# Patient Record
Sex: Male | Born: 1995 | Race: White | Hispanic: No | Marital: Single | Smoking: Never smoker
Health system: Southern US, Community
[De-identification: ages and names within clinical notes are randomized; demographics above are authoritative.]

---

## 2021-07-23 DIAGNOSIS — F411 Generalized anxiety disorder: Secondary | ICD-10-CM | POA: Diagnosis present

## 2021-07-23 DIAGNOSIS — H9325 Central auditory processing disorder: Secondary | ICD-10-CM | POA: Insufficient documentation

## 2021-12-02 ENCOUNTER — Inpatient Hospital Stay (HOSPITAL_COMMUNITY)
Admission: EM | Admit: 2021-12-02 | Discharge: 2021-12-04 | DRG: 916 | Disposition: A | Discharge status: Home / Self Care | Payer: No Typology Code available for payment source | Attending: Internal Medicine | Admitting: Internal Medicine

## 2021-12-02 ENCOUNTER — Encounter (HOSPITAL_COMMUNITY): Payer: Self-pay | Admitting: Internal Medicine

## 2021-12-02 ENCOUNTER — Emergency Department (HOSPITAL_COMMUNITY): Payer: No Typology Code available for payment source

## 2021-12-02 ENCOUNTER — Other Ambulatory Visit: Payer: Self-pay

## 2021-12-02 DIAGNOSIS — R55 Syncope and collapse: Secondary | ICD-10-CM | POA: Diagnosis present

## 2021-12-02 DIAGNOSIS — E872 Acidosis, unspecified: Secondary | ICD-10-CM | POA: Diagnosis present

## 2021-12-02 DIAGNOSIS — E8809 Other disorders of plasma-protein metabolism, not elsewhere classified: Secondary | ICD-10-CM | POA: Diagnosis present

## 2021-12-02 DIAGNOSIS — Z91018 Allergy to other foods: Secondary | ICD-10-CM

## 2021-12-02 DIAGNOSIS — T7840XA Allergy, unspecified, initial encounter: Principal | ICD-10-CM

## 2021-12-02 DIAGNOSIS — T782XXA Anaphylactic shock, unspecified, initial encounter: Principal | ICD-10-CM | POA: Diagnosis present

## 2021-12-02 DIAGNOSIS — Z91013 Allergy to seafood: Secondary | ICD-10-CM | POA: Diagnosis not present

## 2021-12-02 DIAGNOSIS — Z886 Allergy status to analgesic agent status: Secondary | ICD-10-CM | POA: Diagnosis not present

## 2021-12-02 DIAGNOSIS — F411 Generalized anxiety disorder: Secondary | ICD-10-CM | POA: Diagnosis present

## 2021-12-02 DIAGNOSIS — S0083XA Contusion of other part of head, initial encounter: Secondary | ICD-10-CM | POA: Diagnosis present

## 2021-12-02 DIAGNOSIS — E876 Hypokalemia: Secondary | ICD-10-CM | POA: Diagnosis present

## 2021-12-02 DIAGNOSIS — R569 Unspecified convulsions: Secondary | ICD-10-CM | POA: Diagnosis present

## 2021-12-02 DIAGNOSIS — S0081XA Abrasion of other part of head, initial encounter: Secondary | ICD-10-CM | POA: Diagnosis present

## 2021-12-02 DIAGNOSIS — R251 Tremor, unspecified: Secondary | ICD-10-CM | POA: Diagnosis not present

## 2021-12-02 DIAGNOSIS — D72829 Elevated white blood cell count, unspecified: Secondary | ICD-10-CM | POA: Diagnosis present

## 2021-12-02 DIAGNOSIS — S0990XA Unspecified injury of head, initial encounter: Secondary | ICD-10-CM | POA: Diagnosis not present

## 2021-12-02 LAB — BASIC METABOLIC PANEL
Anion gap: 6 (ref 5–15)
BUN: 21 mg/dL — ABNORMAL HIGH (ref 6–20)
CO2: 21 mmol/L — ABNORMAL LOW (ref 22–32)
Calcium: 7.7 mg/dL — ABNORMAL LOW (ref 8.9–10.3)
Chloride: 117 mmol/L — ABNORMAL HIGH (ref 98–111)
Creatinine, Ser: 0.84 mg/dL (ref 0.61–1.24)
GFR, Estimated: >60 mL/min (ref >60)
Glucose, Bld: 107 mg/dL — ABNORMAL HIGH (ref 70–99)
Potassium: 2.8 mmol/L — ABNORMAL LOW (ref 3.5–5.1)
Sodium: 144 mmol/L (ref 135–145)

## 2021-12-02 LAB — COMPREHENSIVE METABOLIC PANEL
ALT: 13 U/L (ref 0–44)
AST: 17 U/L (ref 15–41)
Albumin: 3 g/dL — ABNORMAL LOW (ref 3.5–5.0)
Alkaline Phosphatase: 31 U/L — ABNORMAL LOW (ref 38–126)
Anion gap: 6 (ref 5–15)
BUN: 19 mg/dL (ref 6–20)
CO2: 16 mmol/L — ABNORMAL LOW (ref 22–32)
Calcium: 6.2 mg/dL — CL (ref 8.9–10.3)
Chloride: 121 mmol/L — ABNORMAL HIGH (ref 98–111)
Creatinine, Ser: 0.74 mg/dL (ref 0.61–1.24)
GFR, Estimated: >60 mL/min (ref >60)
Glucose, Bld: 146 mg/dL — ABNORMAL HIGH (ref 70–99)
Potassium: 2.3 mmol/L — CL (ref 3.5–5.1)
Sodium: 143 mmol/L (ref 135–145)
Total Bilirubin: 0.6 mg/dL (ref 0.3–1.2)
Total Protein: 4.5 g/dL — ABNORMAL LOW (ref 6.5–8.1)

## 2021-12-02 LAB — CBC WITH DIFFERENTIAL/PLATELET
Abs Immature Granulocytes: 0.06 10*3/uL (ref 0.00–0.07)
Basophils Absolute: 0 10*3/uL (ref 0.0–0.1)
Basophils Relative: 0 %
Eosinophils Absolute: 0 10*3/uL (ref 0.0–0.5)
Eosinophils Relative: 0 %
HCT: 42.7 % (ref 39.0–52.0)
Hemoglobin: 14.7 g/dL (ref 13.0–17.0)
Immature Granulocytes: 0 %
Lymphocytes Relative: 4 %
Lymphs Abs: 0.6 10*3/uL — ABNORMAL LOW (ref 0.7–4.0)
MCH: 28.8 pg (ref 26.0–34.0)
MCHC: 34.4 g/dL (ref 30.0–36.0)
MCV: 83.6 fL (ref 80.0–100.0)
Monocytes Absolute: 0.4 10*3/uL (ref 0.1–1.0)
Monocytes Relative: 3 %
Neutro Abs: 14.1 10*3/uL — ABNORMAL HIGH (ref 1.7–7.7)
Neutrophils Relative %: 93 %
Platelets: 183 10*3/uL (ref 150–400)
RBC: 5.11 MIL/uL (ref 4.22–5.81)
RDW: 12.7 % (ref 11.5–15.5)
WBC: 15.3 10*3/uL — ABNORMAL HIGH (ref 4.0–10.5)
nRBC: 0 % (ref 0.0–0.2)

## 2021-12-02 LAB — URINALYSIS, ROUTINE W REFLEX MICROSCOPIC
Bilirubin Urine: NEGATIVE
Glucose, UA: 50 mg/dL — AB
Ketones, ur: 5 mg/dL — AB
Leukocytes,Ua: NEGATIVE
Nitrite: NEGATIVE
Protein, ur: NEGATIVE mg/dL
Specific Gravity, Urine: 1.016 (ref 1.005–1.030)
pH: 5 (ref 5.0–8.0)

## 2021-12-02 LAB — TROPONIN I (HIGH SENSITIVITY): Troponin I (High Sensitivity): 5 ng/L (ref <18)

## 2021-12-02 LAB — RAPID URINE DRUG SCREEN, HOSP PERFORMED
Amphetamines: NOT DETECTED
Barbiturates: NOT DETECTED
Benzodiazepines: NOT DETECTED
Cocaine: NOT DETECTED
Opiates: NOT DETECTED
Tetrahydrocannabinol: NOT DETECTED

## 2021-12-02 LAB — CBG MONITORING, ED: Glucose-Capillary: 165 mg/dL — ABNORMAL HIGH (ref 70–99)

## 2021-12-02 LAB — MAGNESIUM: Magnesium: 2.2 mg/dL (ref 1.7–2.4)

## 2021-12-02 LAB — TSH: TSH: 0.634 u[IU]/mL (ref 0.350–4.500)

## 2021-12-02 IMAGING — CT CT CERVICAL SPINE W/O CM
3 of 4 series · 13 of 33 positions shown, 16 images · non-contrast
Comparison: None Available.

CLINICAL DATA: Fall with possible seizure and head injury while in
shower.



[Series 6: orthogonal bone · axial · 0.40mm/px · z∈[-331,-186]mm · 5 of 113 slices shown, 7 images]
[im 19/113  soft-tissue]
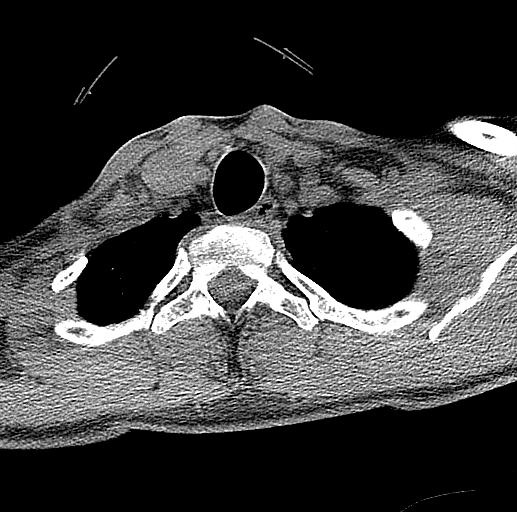
[im 19/113  bone]
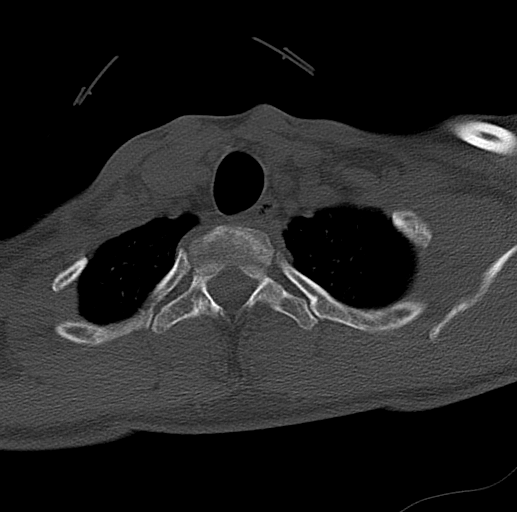
[im 38/113  bone]
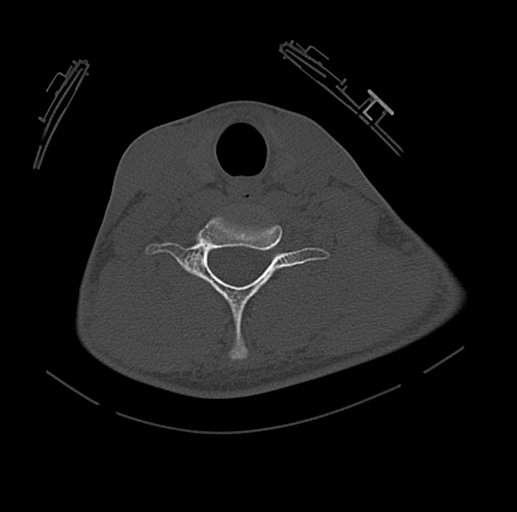
[im 57/113  bone]
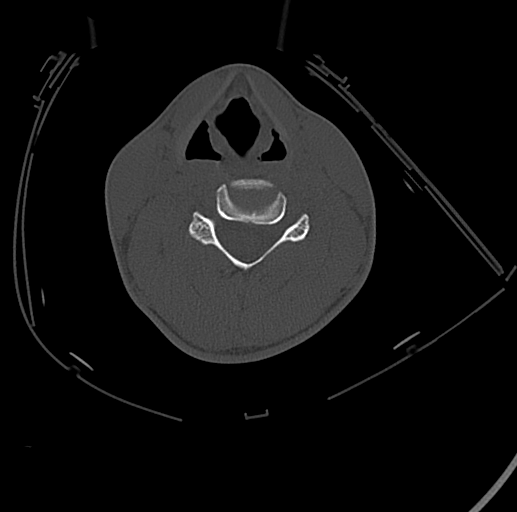
[im 75/113  bone]
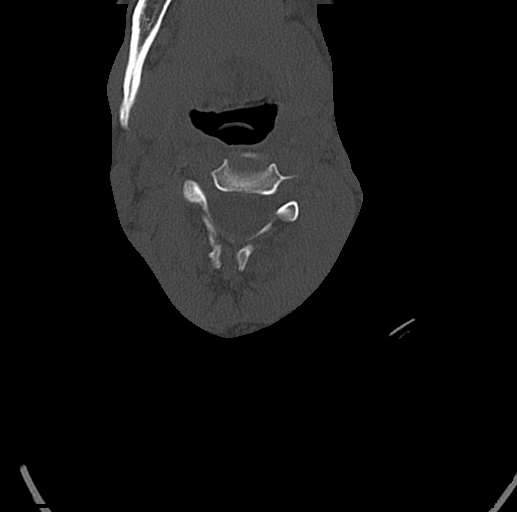
[im 94/113  soft-tissue]
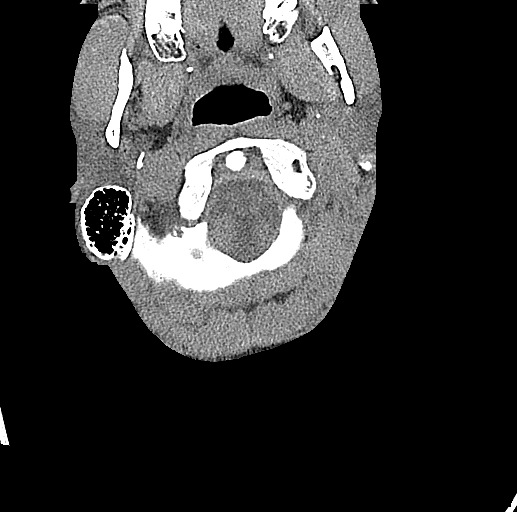
[im 94/113  bone]
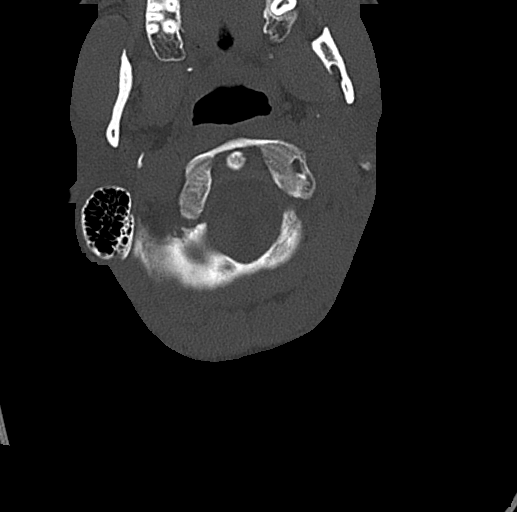

[Series 7: coronal bone · coronal · 0.40mm/px · 3 of 95 slices shown]
[im 19/95  bone]
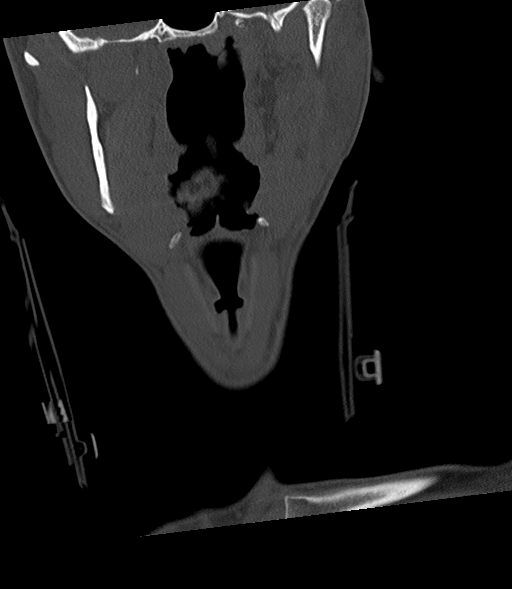
[im 38/95  bone]
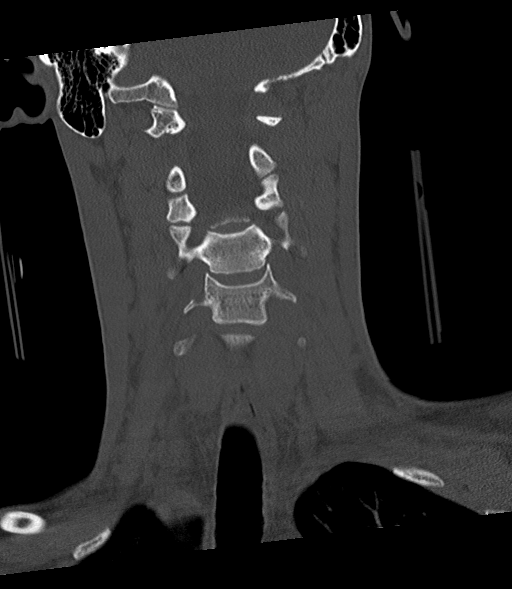
[im 57/95  bone]
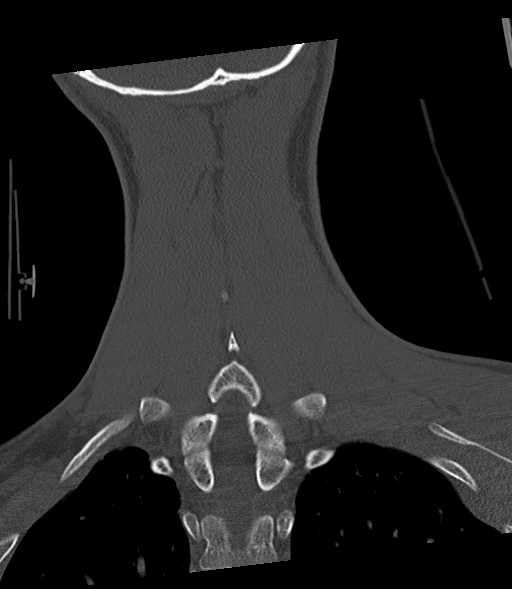

[Series 8: sagittal bone · sagittal · 0.32mm/px · 5 of 61 slices shown, 6 images]
[im 21/61  bone]
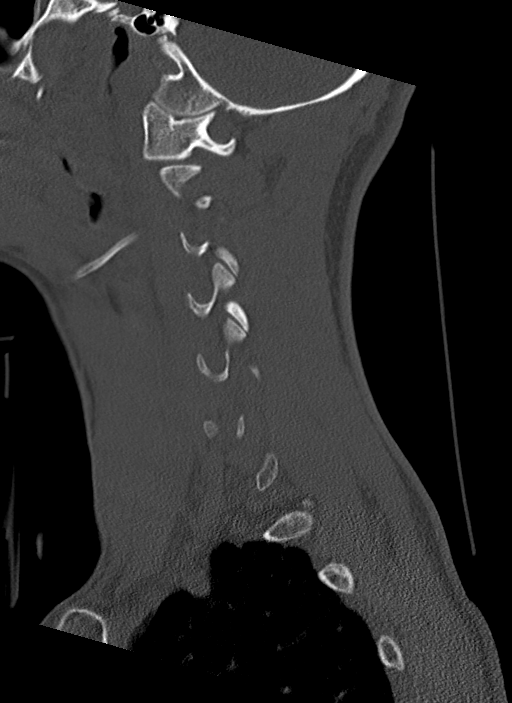
[im 26/61  bone]
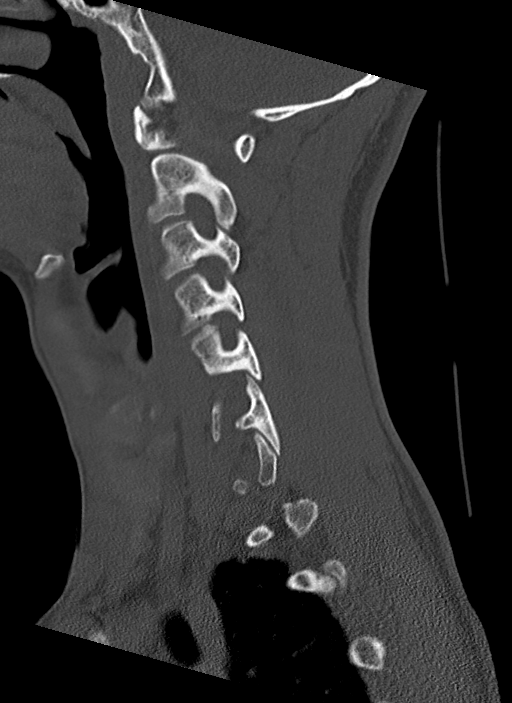
[im 31/61  soft-tissue]
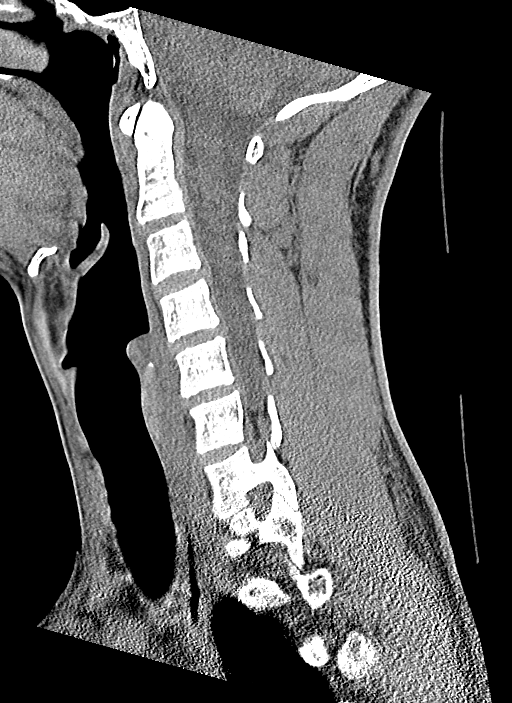
[im 31/61  bone]
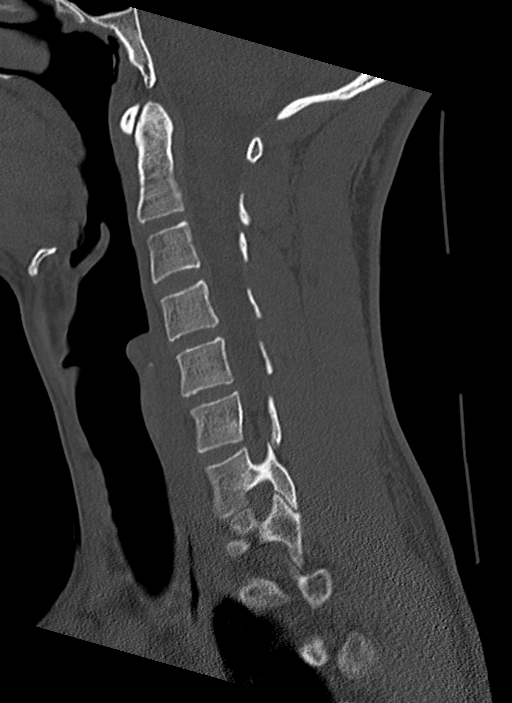
[im 36/61  bone]
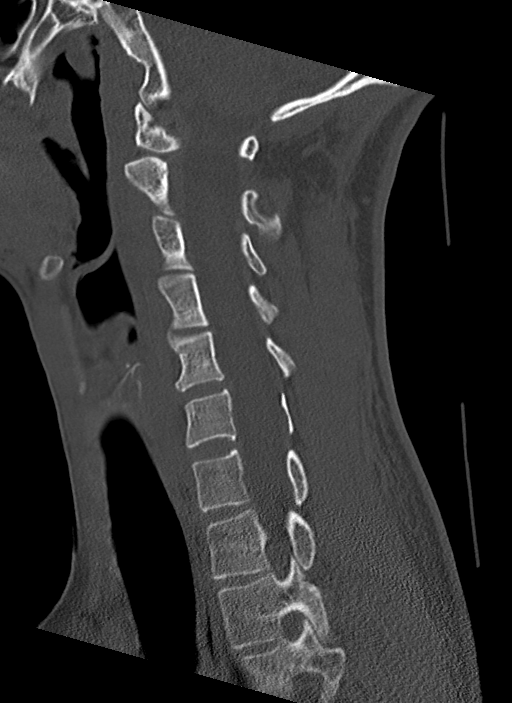
[im 41/61  bone]
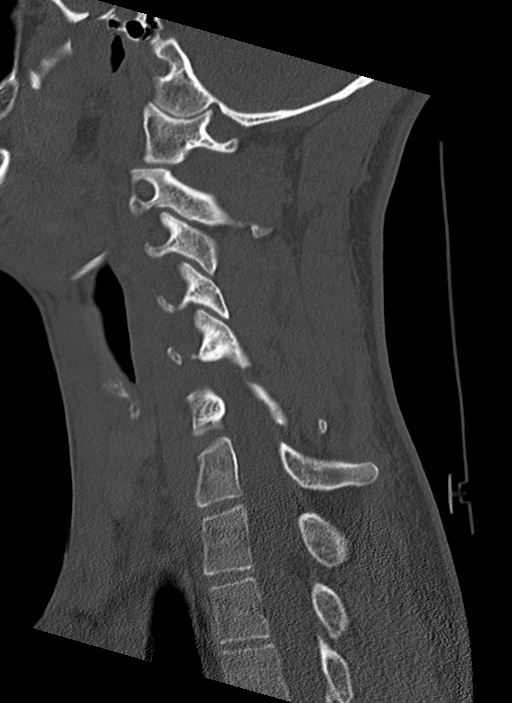

[13 of 33 positions shown; findings below may reference images not displayed]

FINDINGS: CT HEAD FINDINGS

Brain: Ventricles, cisterns and other CSF spaces are normal. There
is no mass, mass effect, shift of midline structures or acute
hemorrhage. No evidence of acute infarction.

Vascular: No hyperdense vessel or unexpected calcification.

Skull: Normal. Negative for fracture or focal lesion.

Sinuses/Orbits: No acute finding.

Other: None.

CT CERVICAL SPINE FINDINGS

Alignment: Normal.

Skull base and vertebrae: Vertebral body heights are normal.
Atlantoaxial articulation is normal. No significant neural foraminal
narrowing. No acute fracture.

Soft tissues and spinal canal: Prevertebral soft tissues are normal.
No significant spinal canal stenosis.

Disc levels:  Minimal broad-based disc bulge at the C3-4 level.

Upper chest: No acute findings.

Other: None.
IMPRESSION: 1. Normal head CT.
2. No acute cervical spine injury.
3. Minimal degenerative disc disease at the C3-4 level.

## 2021-12-02 IMAGING — CT CT HEAD W/O CM
4 series · 16 of 47 positions shown, 18 images · non-contrast
Comparison: None Available.

CLINICAL DATA: Fall with possible seizure and head injury while in
shower.



[Series 2: head wo · axial · 0.47mm/px · z∈[-145,-25]mm · 7 of 32 slices shown, 9 images]
[im 4/32  brain]
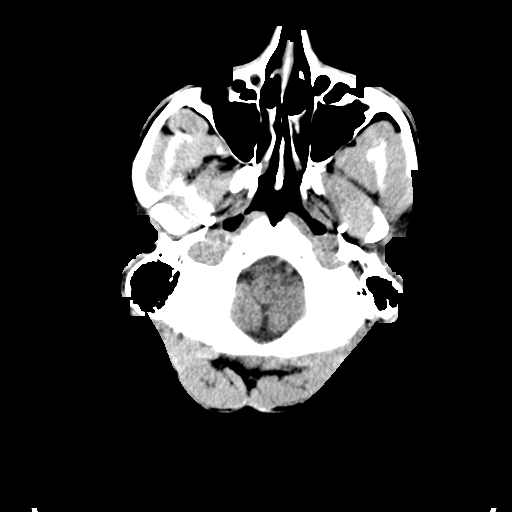
[im 4/32  bone]
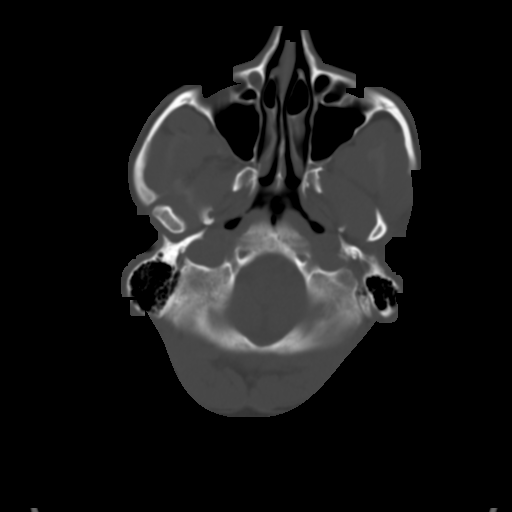
[im 8/32  brain]
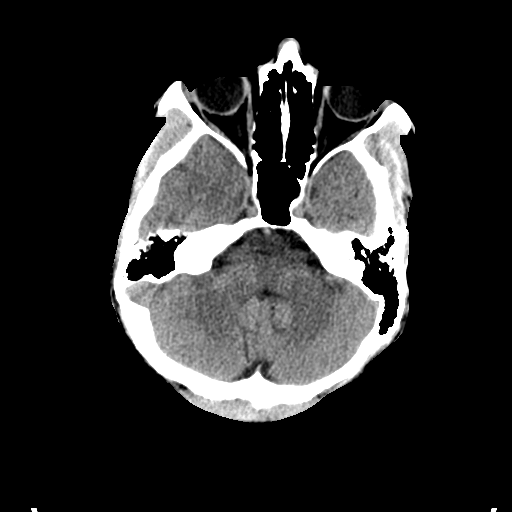
[im 12/32  brain]
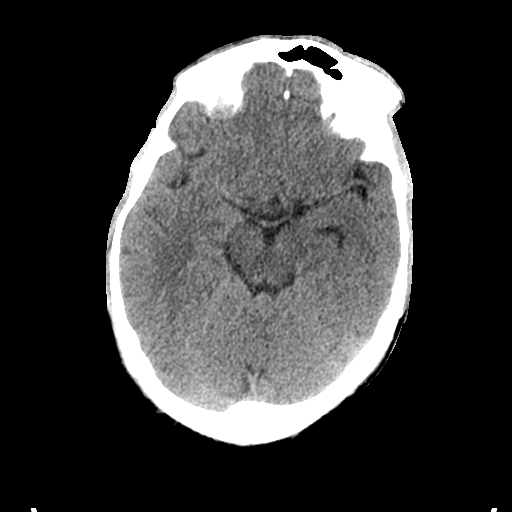
[im 16/32  brain]
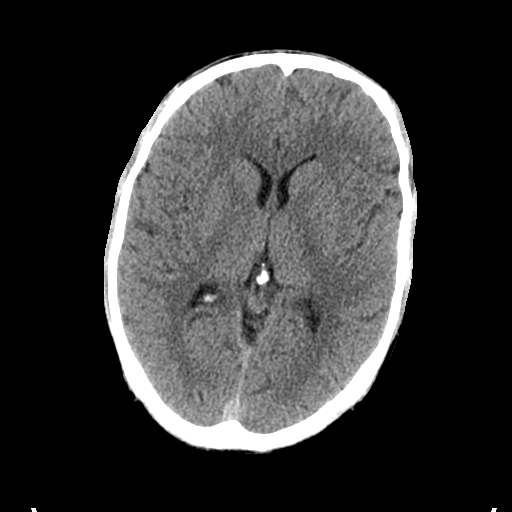
[im 20/32  brain]
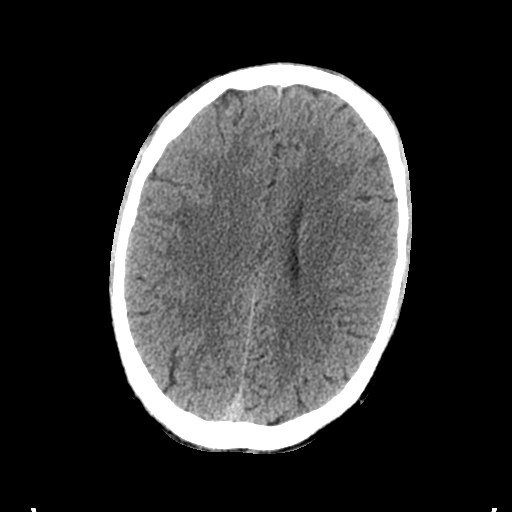
[im 20/32  bone]
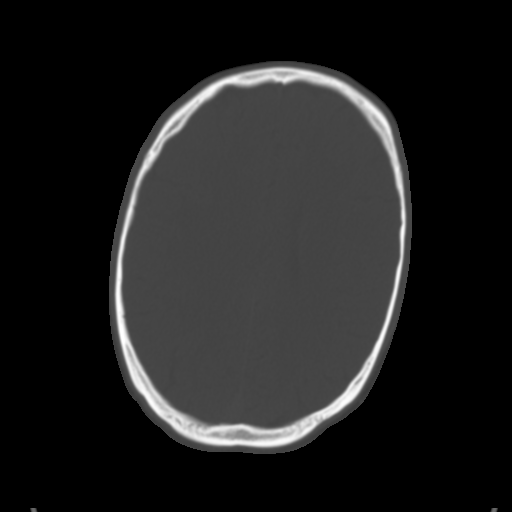
[im 24/32  brain]
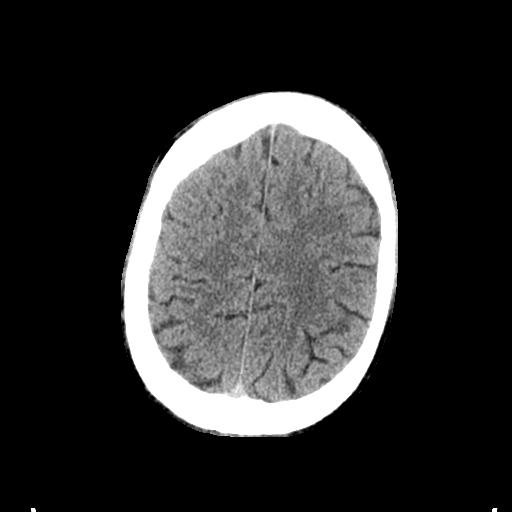
[im 28/32  brain]
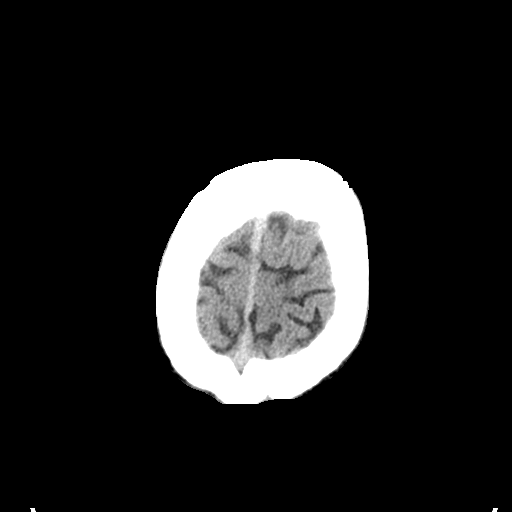

[Series 3: head bone · axial · 0.47mm/px · z∈[-146,-114]mm · 3 of 78 slices shown]
[im 8/78  bone]
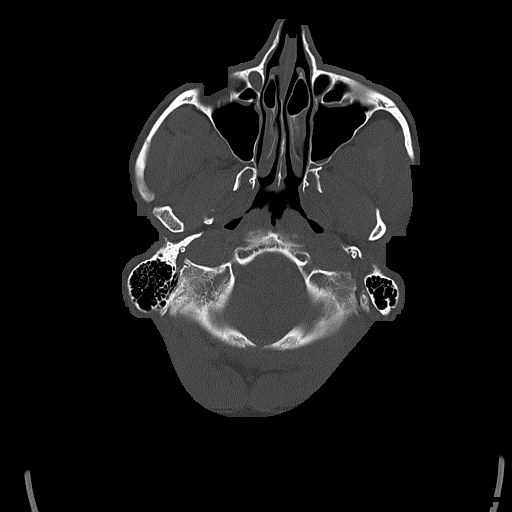
[im 16/78  bone]
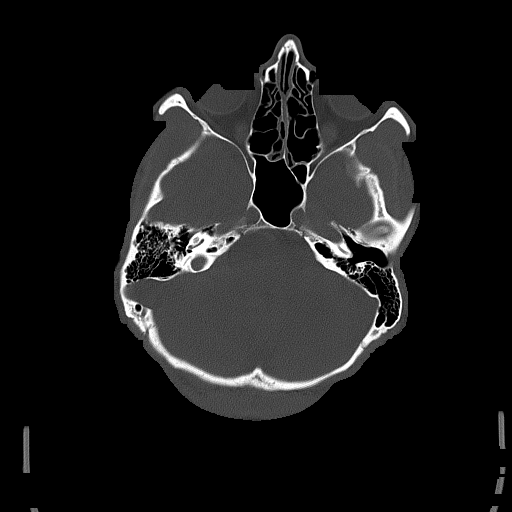
[im 24/78  bone]
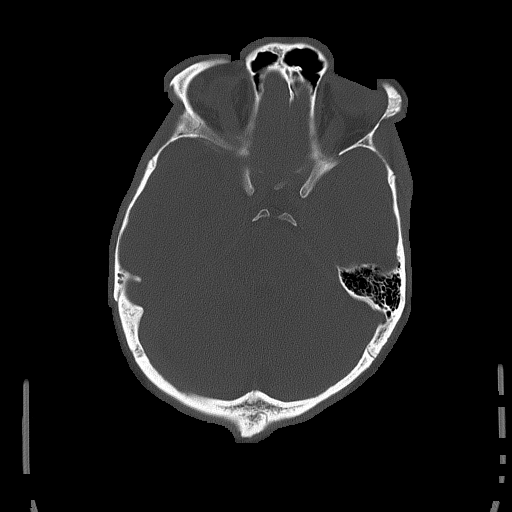

[Series 5: coronal soft tissue · coronal · 0.30mm/px · 3 of 73 slices shown]
[im 25/73  brain]
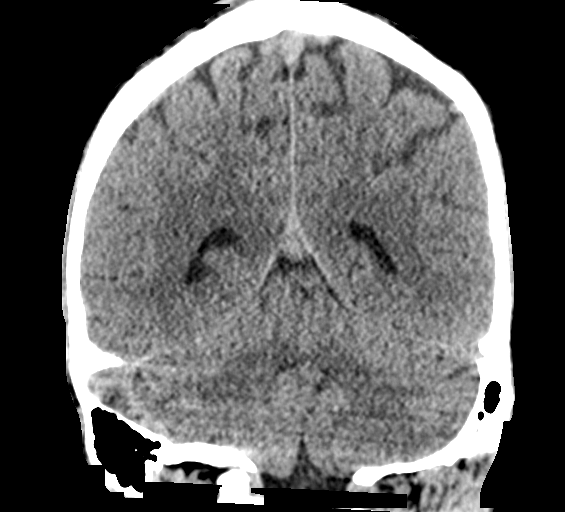
[im 33/73  brain]
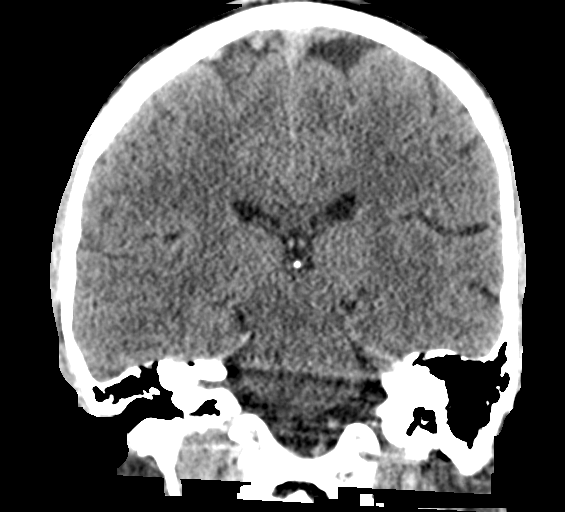
[im 41/73  brain]
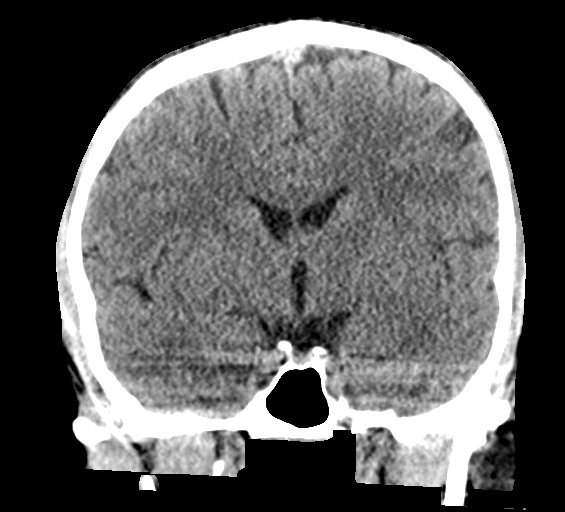

[Series 6: sagittal soft tissue · sagittal · 0.30mm/px · 3 of 58 slices shown]
[im 20/58  brain]
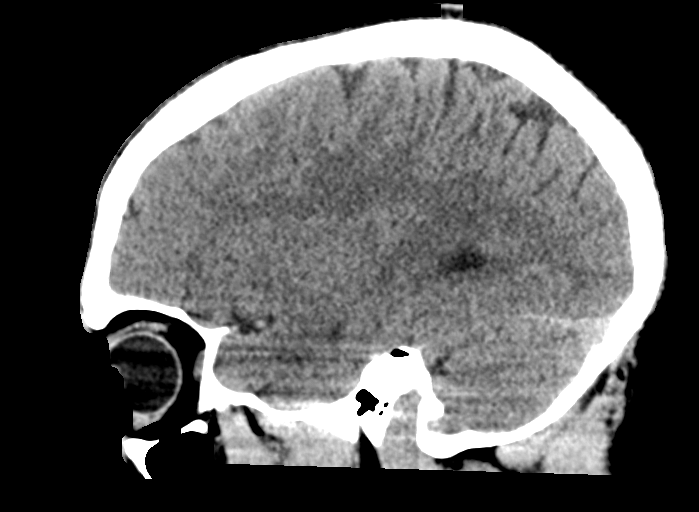
[im 29/58  brain]
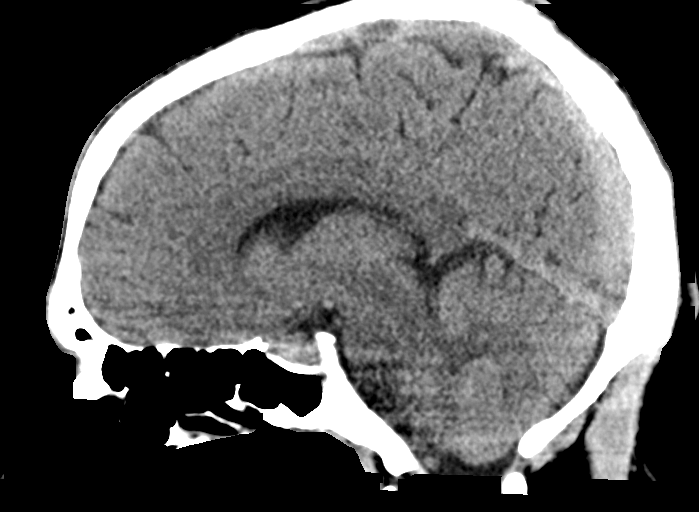
[im 39/58  brain]
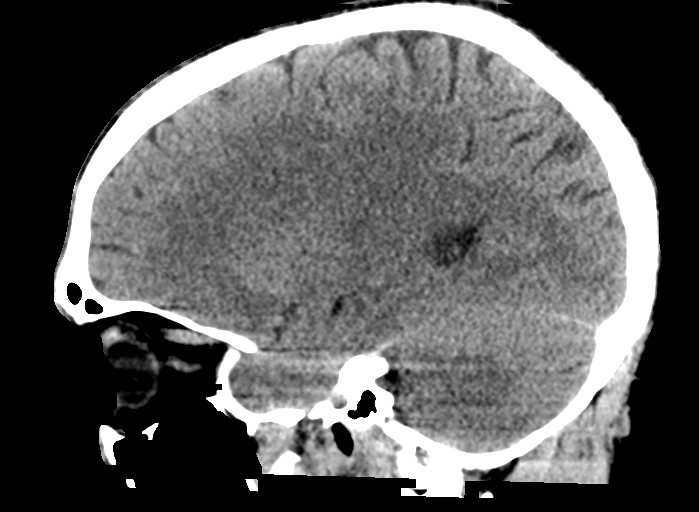

[16 of 47 positions shown; findings below may reference images not displayed]

FINDINGS: CT HEAD FINDINGS

Brain: Ventricles, cisterns and other CSF spaces are normal. There
is no mass, mass effect, shift of midline structures or acute
hemorrhage. No evidence of acute infarction.

Vascular: No hyperdense vessel or unexpected calcification.

Skull: Normal. Negative for fracture or focal lesion.

Sinuses/Orbits: No acute finding.

Other: None.

CT CERVICAL SPINE FINDINGS

Alignment: Normal.

Skull base and vertebrae: Vertebral body heights are normal.
Atlantoaxial articulation is normal. No significant neural foraminal
narrowing. No acute fracture.

Soft tissues and spinal canal: Prevertebral soft tissues are normal.
No significant spinal canal stenosis.

Disc levels:  Minimal broad-based disc bulge at the C3-4 level.

Upper chest: No acute findings.

Other: None.
IMPRESSION: 1. Normal head CT.
2. No acute cervical spine injury.
3. Minimal degenerative disc disease at the C3-4 level.

## 2021-12-02 MED ORDER — ONDANSETRON HCL 4 MG PO TABS: 4.0000 mg | ORAL_TABLET | Freq: Four times a day (QID) | ORAL | Status: DC | PRN

## 2021-12-02 MED ORDER — POTASSIUM CHLORIDE 10 MEQ/100ML IV SOLN
10.0000 meq | INTRAVENOUS | Status: AC
  Administered 2021-12-02 (×3): 10 meq via INTRAVENOUS
  Filled 2021-12-02 (×3): qty 100

## 2021-12-02 MED ORDER — SODIUM CHLORIDE 0.9 % IV BOLUS
1000.0000 mL | Freq: Once | INTRAVENOUS | Status: AC
  Administered 2021-12-02: 1000 mL via INTRAVENOUS

## 2021-12-02 MED ORDER — POTASSIUM CHLORIDE IN NACL 40-0.9 MEQ/L-% IV SOLN
INTRAVENOUS | Status: DC
  Filled 2021-12-02 (×2): qty 1000

## 2021-12-02 MED ORDER — SODIUM CHLORIDE 0.9% FLUSH
3.0000 mL | Freq: Two times a day (BID) | INTRAVENOUS | Status: DC
  Administered 2021-12-02 – 2021-12-03 (×2): 3 mL via INTRAVENOUS

## 2021-12-02 MED ORDER — CALCIUM GLUCONATE-NACL 1-0.675 GM/50ML-% IV SOLN
1.0000 g | Freq: Once | INTRAVENOUS | Status: AC
  Administered 2021-12-02: 1000 mg via INTRAVENOUS
  Filled 2021-12-02: qty 50

## 2021-12-02 MED ORDER — ONDANSETRON HCL 4 MG/2ML IJ SOLN: 4.0000 mg | Freq: Four times a day (QID) | INTRAMUSCULAR | Status: DC | PRN

## 2021-12-02 NOTE — H&P (Signed)
History and Physical    Patient: Danny Rich GHW:[id number] DOB: August 01, 1995 DOA: 12/02/2021 DOS: the patient was seen and examined on 12/02/2021 PCP: Jordan Hawks, PA-C  Patient coming from: Home  Chief Complaint:  Chief Complaint  Patient presents with   Seizures   Loss of Consciousness   Allergic Reaction   HPI: Danny Rich is a 26 y.o. 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  Patient has been careful what he uses including his soap and other products due to his history of anaphylaxis.  He was apparently in the shower when mom heard a loud sound.  The last thing he remembered was him taking a shower with his cell.  When EMS arrived patient was having significant rashes all over with swollen face and side of early anaphylaxis.  He was noted to have passed out and also having some seizure-like activities.  On arrival in the ER patient has improvement fully awake.  He received epinephrine, Benadryl, Zofran, Solu-Medrol and albuterol nebs.  So far the rashes have resolved.  He did have some cough initially which also has resolved apparently after treatment.  He was placed on E. coli brought to the ER.  Patient states he has used the same type of soap for a while.  The.  He passed out was brief with no postictal symptoms.  Additionally patient was found to have potassium of 2.3 as well as hypocalcemia.  He has been eating and drinking adequately.  He drinks mostly Water.  Urine drug screen negative for any drugs.  Patient is being admitted to the hospital for further evaluation and treatment.  Review of Systems: As mentioned in the history of present illness. All other systems reviewed and are negative. History reviewed. No pertinent past medical history. History reviewed. No pertinent surgical  history. Social History:  has no history on file for tobacco use, alcohol use, and drug use.  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   Shellfish Allergy Anaphylaxis    History reviewed. No pertinent family history.  Prior to Admission medications   Medication Sig Start Date End Date Taking? Authorizing Provider  acetaminophen (TYLENOL) 500 MG tablet Take 1,000 mg by mouth daily as needed for headache.   Yes [provider]  vitamin C (ASCORBIC ACID) 500 MG tablet Take 1,000 mg by mouth at bedtime.   Yes [provider]    Physical Exam: Vitals:   12/02/21 1945 12/02/21 2000 12/02/21 2100 12/02/21 2200  BP:  104/60 100/62 118/62  Pulse: 96 88 87 97  Resp: 17 17 16 10   Temp:      TempSrc:      SpO2: 96% 95% 96% 97%  Weight:      Height:        General: Stable with no acute distress very anxious HEENT: PERRL, EOMI, no pallor no jaundice, mild bruising on the face around the chin Neck: Supple, no JVD no lymphadenopathy  Respiratory: Good air entry bilaterally no wheeze rales or crackles Cardiovascular: Sinus tachycardia Abdomen: Soft nontender with positive bowel sounds Extremities: No significant edema sinus or clubbing Skin exam: Multiple rashes fading away consistent with urticaria Musculoskeletal: No joint swelling tenderness or muscle wasting Neuro exam: Normal power, normal tone, nonfocal exam Psych: Anxious, awake alert and oriented x3  Data Reviewed:  Afebrile, blood pressure 150/85, pulse 100, respiratory 28, sats 94% on room air White count 15.3, potassium 2.3, chloride 117, CO2 21 BUN 20 creatinine  1.84 calcium 6.2 urinalysis so far negative urine drug screen is negative.  CT cervical spine and CT head without contrast negative.  EKG showed normal sinus rhythm  Assessment and Plan:   #1 syncope versus seizure: Patient most likely had a syncopal episode based on presentation.  No prior history of seizures.  With severe hypokalemia and acute anaphylactic reaction he more than likely had a syncopal episode.  Additionally patient did not go into postictal state.  We will admit the patient still in the with seizure precaution.  Work-up of the syncopal episode.  Get echocardiogram and MRI of the brain.  Hydrate patient.  Replete electrolytes.  #2 severe hypokalemia: No obvious cause.  Patient denies nausea vomiting or diarrhea.  He has not had any medications including diuretics.  At this point we will replete potassium.  Check magnesium level.  Encourage patient to stay hydrated at home.  If magnesium is low this may also play a role in possible syncope if he had arrhythmias.  #3 anaphylactic reaction: Patient appears to have had anaphylactic reaction tonight.  He is usually having reactions to the substance listed above.  Suspected.  May have caused at this time around.  He will avoid that.  In the meantime monitor the patient.  Treat with steroids Benadryl as needed.  Also epinephrine.  #4 generalized anxiety disorder: Confirm home regimen and continue.  Add Ativan as needed  #5 leukocytosis: Possibly demargination and dehydration.  #6 hypocalcemia: Related to hypocalcemia.  Corrected calcium close to normal.  Continue monitoring and recheck    Advance Care Planning:   Code Status: Not on file full code  Consults: None  Family Communication: Mother at bedside  Severity of Illness: The appropriate patient status for this patient is INPATIENT. Inpatient status is judged to be reasonable and necessary in order to provide the required intensity of service to ensure the patient's safety. The  patient's presenting symptoms, physical exam findings, and initial radiographic and laboratory data in the context of their chronic comorbidities is felt to place them at high risk for further clinical deterioration. Furthermore, it is not anticipated that the patient will be medically stable for discharge from the hospital within 2 midnights of admission.   * I certify that at the point of admission it is my clinical judgment that the patient will require inpatient hospital care spanning beyond 2 midnights from the point of admission due to high intensity of service, high risk for further deterioration and high frequency of surveillance required.*  AuthorLonia Blood, MD 12/02/2021 11:18 PM  For on call review [email or web address].

## 2021-12-02 NOTE — ED Provider Notes (Signed)
[facility]-EMERGENCY DEPT Provider Note   CSN: [id number] Arrival date & time: 12/02/21  1205     History  Chief Complaint  Patient presents with   Seizures   Loss of Consciousness   Allergic Reaction    Danny Rich is a 26 y.o. male.  The history is provided by the patient, medical records and the EMS personnel (ems report to nursing). No language interpreter was used.  Seizures Seizure activity on arrival: no   Seizure type:  Unable to specify Return to baseline: yes   Progression:  Resolved Context: not family hx of seizures, not fever, not possible medication ingestion and not previous head injury   Recent head injury:  During the event PTA treatment:  None History of seizures: no   Loss of Consciousness Episode history:  Single Most recent episode:  Today Timing:  Unable to specify Witnessed: no   Relieved by:  Nothing Worsened by:  Nothing Ineffective treatments:  None tried Associated symptoms: difficulty breathing, nausea, seizures and shortness of breath (wheeze and cough)   Associated symptoms: no chest pain, no confusion, no diaphoresis, no dizziness, no fever, no focal weakness, no headaches, no palpitations, no vomiting and no weakness   Allergic Reaction Presenting symptoms: difficulty breathing, rash, swelling and wheezing   Severity:  Severe Prior allergic episodes:  Food/nut allergies Relieved by:  Antihistamines, epinephrine, steroids and bronchodilators Worsened by:  Nothing      Home Medications Prior to Admission medications   Not on File      Allergies    Aspirin, Shellfish allergy, Cashew nut oil, Hazelnut (filbert) allergy skin test, and Motrin [ibuprofen]    Review of Systems   Review of Systems  Constitutional:  Negative for chills, diaphoresis, fatigue and fever.  HENT:  Negative for congestion.   Eyes:  Negative for visual disturbance.  Respiratory:  Positive for cough, shortness of breath (wheeze and  cough) and wheezing. Negative for chest tightness.   Cardiovascular:  Positive for syncope. Negative for chest pain and palpitations.  Gastrointestinal:  Positive for nausea. Negative for abdominal pain, constipation, diarrhea and vomiting.  Genitourinary:  Negative for dysuria and flank pain.  Musculoskeletal:  Negative for back pain, neck pain and neck stiffness.  Skin:  Positive for rash and wound (bruising from fall).  Neurological:  Positive for seizures. Negative for dizziness, focal weakness, speech difficulty, weakness, light-headedness, numbness and headaches.  Psychiatric/Behavioral:  Negative for agitation, behavioral problems and confusion.   All other systems reviewed and are negative.   Physical Exam Updated Vital Signs BP (!) 152/85   Pulse 97   Temp 97.9 F (36.6 C) (Oral)   Resp 18   Ht 6' (1.829 m)   Wt 65.8 kg   SpO2 100%   BMI 19.67 kg/m  Physical Exam Vitals and nursing note reviewed.  Constitutional:      General: He is not in acute distress.    Appearance: He is well-developed.  HENT:     Head: Abrasion and contusion present.     Comments: Bruising to chin and abrasion to right forehead    Nose: No congestion or rhinorrhea.     Mouth/Throat:     Mouth: Mucous membranes are moist.     Pharynx: No oropharyngeal exudate or posterior oropharyngeal erythema.  Eyes:     Extraocular Movements: Extraocular movements intact.     Conjunctiva/sclera: Conjunctivae normal.     Pupils: Pupils are equal, round, and reactive to light.  Cardiovascular:  Rate and Rhythm: Normal rate and regular rhythm.     Heart sounds: No murmur heard. Pulmonary:     Effort: Pulmonary effort is normal. No respiratory distress.     Breath sounds: Normal breath sounds. No wheezing, rhonchi or rales.  Chest:     Chest wall: No tenderness.  Abdominal:     Palpations: Abdomen is soft.     Tenderness: There is no abdominal tenderness. There is no right CVA tenderness, left CVA  tenderness, guarding or rebound.  Musculoskeletal:        General: No swelling or tenderness.     Cervical back: Neck supple. No tenderness.     Right lower leg: No edema.     Left lower leg: No edema.  Skin:    General: Skin is warm and dry.     Capillary Refill: Capillary refill takes less than 2 seconds.     Findings: Bruising and rash present. No erythema.  Neurological:     General: No focal deficit present.     Mental Status: He is alert and oriented to person, place, and time. Mental status is at baseline.     Sensory: No sensory deficit.     Motor: No weakness.     Coordination: Coordination normal.  Psychiatric:        Mood and Affect: Mood normal.     ED Results / Procedures / Treatments   Labs (all labs ordered are listed, but only abnormal results are displayed) Labs Reviewed  COMPREHENSIVE METABOLIC PANEL - Abnormal; Notable for the following components:      Result Value   Potassium 2.3 (*)    Chloride 121 (*)    CO2 16 (*)    Glucose, Bld 146 (*)    Calcium 6.2 (*)    Total Protein 4.5 (*)    Albumin 3.0 (*)    Alkaline Phosphatase 31 (*)    All other components within normal limits  CBC WITH DIFFERENTIAL/PLATELET - Abnormal; Notable for the following components:   WBC 15.3 (*)    Neutro Abs 14.1 (*)    Lymphs Abs 0.6 (*)    All other components within normal limits  URINALYSIS, ROUTINE W REFLEX MICROSCOPIC - Abnormal; Notable for the following components:   APPearance CLOUDY (*)    Glucose, UA 50 (*)    Hgb urine dipstick SMALL (*)    Ketones, ur 5 (*)    Bacteria, UA RARE (*)    All other components within normal limits  BASIC METABOLIC PANEL - Abnormal; Notable for the following components:   Potassium 2.8 (*)    Chloride 117 (*)    CO2 21 (*)    Glucose, Bld 107 (*)    BUN 21 (*)    Calcium 7.7 (*)    All other components within normal limits  CBG MONITORING, ED - Abnormal; Notable for the following components:   Glucose-Capillary 165 (*)     All other components within normal limits  TSH  RAPID URINE DRUG SCREEN, HOSP PERFORMED  MAGNESIUM  CBG MONITORING, ED  TROPONIN I (HIGH SENSITIVITY)  TROPONIN I (HIGH SENSITIVITY)    EKG EKG Interpretation  Date/Time:  Sunday December 02 2021 14:50:24 EDT Ventricular Rate:  97 PR Interval:  151 QRS Duration: 89 QT Interval:  359 QTC Calculation: 456 R Axis:   91 Text Interpretation: Sinus rhythm Consider left atrial enlargement Borderline right axis deviation no prior ECG for comparison. No STEMI Confirmed by Haisley Arens, Thayer Ohm ([id number]) on  12/02/2021 3:59:03 PM  Radiology CT HEAD WO CONTRAST ( )  Result Date: 12/02/2021 CLINICAL DATA:  Fall with possible seizure and head injury while in shower. EXAM: CT HEAD WITHOUT CONTRAST CT CERVICAL SPINE WITHOUT CONTRAST TECHNIQUE: Multidetector CT imaging of the head and cervical spine was performed following the standard protocol without intravenous contrast. Multiplanar CT image reconstructions of the cervical spine were also generated. RADIATION DOSE REDUCTION: This exam was performed according to the departmental dose-optimization program which includes automated exposure control, adjustment of the mA and/or kV according to patient size and/or use of iterative reconstruction technique. COMPARISON:  None Available. FINDINGS: CT HEAD FINDINGS Brain: Ventricles, cisterns and other CSF spaces are normal. There is no mass, mass effect, shift of midline structures or acute hemorrhage. No evidence of acute infarction. Vascular: No hyperdense vessel or unexpected calcification. Skull: Normal. Negative for fracture or focal lesion. Sinuses/Orbits: No acute finding. Other: None. CT CERVICAL SPINE FINDINGS Alignment: Normal. Skull base and vertebrae: Vertebral body heights are normal. Atlantoaxial articulation is normal. No significant neural foraminal narrowing. No acute fracture. Soft tissues and spinal canal: Prevertebral soft tissues are normal. No significant  spinal canal stenosis. Disc levels:  Minimal broad-based disc bulge at the C3-4 level. Upper chest: No acute findings. Other: None. IMPRESSION: 1. Normal head CT. 2. No acute cervical spine injury. 3. 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 Electronically Signed   By: Elberta Fortis M.D.   On: 12/02/2021 14:21   CT Cervical Spine Wo Contrast  Result Date: 12/02/2021 CLINICAL DATA:  Fall with possible seizure and head injury while in shower. EXAM: CT HEAD WITHOUT CONTRAST CT CERVICAL SPINE WITHOUT CONTRAST TECHNIQUE: Multidetector CT imaging of the head and cervical spine was performed following the standard protocol without intravenous contrast. Multiplanar CT image reconstructions of the cervical spine were also generated. RADIATION DOSE REDUCTION: This exam was performed according to the departmental dose-optimization program which includes automated exposure control, adjustment of the mA and/or kV according to patient size and/or use of iterative reconstruction technique. COMPARISON:  None Available. FINDINGS: CT HEAD FINDINGS Brain: Ventricles, cisterns and other CSF spaces are normal. There is no mass, mass effect, shift of midline structures or acute hemorrhage. No evidence of acute infarction. Vascular: No hyperdense vessel or unexpected calcification. Skull: Normal. Negative for fracture or focal lesion. Sinuses/Orbits: No acute finding. Other: None. CT CERVICAL SPINE FINDINGS Alignment: Normal. Skull base and vertebrae: Vertebral body heights are normal. Atlantoaxial articulation is normal. No significant neural foraminal narrowing. No acute fracture. Soft tissues and spinal canal: Prevertebral soft tissues are normal. No significant spinal canal stenosis. Disc levels:  Minimal broad-based disc bulge at the C3-4 level. Upper chest: No acute findings. Other: None. IMPRESSION: 1. Normal head CT. 2. No acute cervical spine injury. 3. 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  Electronically Signed   By: Elberta Fortis M.D.   On: 12/02/2021 14:21   DG Chest Portable 1 View  Result Date: 12/02/2021 CLINICAL DATA:  Fall with possible seizure in shower. EXAM: PORTABLE CHEST 1 VIEW COMPARISON:  None Available. FINDINGS: Lungs are adequately inflated without consolidation, effusion or pneumothorax. Cardiomediastinal silhouette is normal. No acute fracture. IMPRESSION: No acute findings. Electronically Signed   By: Elberta Fortis M.D.   On: 12/02/2021 14:03    Procedures Procedures    Medications Ordered in ED Medications  sodium chloride 0.9 % bolus 1,000 mL (0 mLs Intravenous Stopped 12/02/21 1430)  sodium chloride 0.9 % bolus 1,000 mL (  0 mLs Intravenous Stopped 12/02/21 2033)  potassium chloride 10 mEq in 100 mL IVPB (0 mEq Intravenous Stopped 12/02/21 2033)  calcium gluconate 1 g/ 50 mL sodium chloride IVPB (0 mg Intravenous Stopped 12/02/21 1817)    ED Course/ Medical Decision Making/ A&P                           Medical Decision Making Amount and/or Complexity of Data Reviewed Labs: ordered. Radiology: ordered.  Risk Prescription drug management. Decision regarding hospitalization.    Danny Rich is a 26 y.o. male with no significant past medical history aside from significant food allergies who presents with allergic reaction and possible seizure versus syncope.  According to patient, he got in the shower and does not member what happened after.  Per family, they heard a fall in the bathroom and check on him and he was laying facedown on the ground having some convulsing and shaking.  He was unresponsive but that with him on the side.  They noticed swelling and fullness of his face, diffuse rash, nausea, and wheezing.  Patient was seen by EMS and was given Solu-Medrol, Benadryl, epinephrine IM x2, albuterol nebulizer, and Zofran.  He is also given some fluids.  After the Benadryl and epi, his rash has improved in the wheezing and coughing improved.  Due to  the fall and abrasion on his forehead he was placed in a c-collar.  Patient is feeling much better now and does not know what happened.  He denies any history of seizures or any recent alcohol or drug use.  Denies other medication use and otherwise been at his baseline before this episode.  He reports he may not to sleep very well last night but otherwise is at his baseline.  On exam, lungs clear I do not appreciate wheezing.  Chest and abdomen nontender.  Arm and legs nontender.  Normal sensation and strength in extremities.  Normal finger-nose-finger testing.  Symmetric smile.  Clear speech.  Pupils are symmetric and reactive with no Movements.  Patient has small ecchymosis to his chin and on his right forehead with small abrasion.  No lacerations.  No other evidence of acute trauma.  Oropharyngeal exam unremarkable however family reports his face was very swollen when he was having the allergic reaction.  Clinically I do suspect patient had anaphylactic reaction to something and family suspects it could be a new ingredient in shampoo.  We will monitor him for several hours due to the multiple epinephrine doses and will get some screening labs as well.  We will get CT head and neck given the fall to look for acute injury or other cause of this episode.  We will have a shared decision-making conversation after work-up to determine disposition for this possible syncope in the setting of anaphylaxis with some convulsive features which is a first-time seizure.  Patient's work-up began to return.  His potassium came back extremely low at 2.3 with low calcium as well.  EKG was reviewed and did not show evidence of acute hypokalemia initially.  We will repeat to make sure there is not an abnormality with the lab.  He is monitored on telemetry and is denying any new complaints.  Repeat metabolic panel still shows hypokalemia with a potassium of 2.8.  Still has some hypocalcium as well.  We will give IV  potassium and calcium gluconate and some fluids as he is still feeling dehydrated.  His CT head and  neck were reassuring with no evidence of acute bleed or carotic injury from the fall/syncope.  Given this electrolyte disturbance and his sudden suspected syncopal episode with no preceding symptoms, a cardiac cause is also considered.  Theoretically, he could have had an anaphylactic reaction to something causing him to get hypotensive or hypoxic triggering this arrhythmia causing syncopal episode and fall.  We will add on troponin and will still hydrate him.  With the electrolyte abnormality and a syncopal episode, I do anticipate patient may require observation admission for echo and telemetry monitoring.  We will have a shared decision conversation with patient and family after work-up.  10:02 PM After several hours, patient has no further allergic reaction symptoms.  I suspect he is clear from anaphylactic perspective.  His syncope in the setting of electrolyte abnormalities is still of concern.  Due to this, we will call for admission for likely echo and possible EEG if that was deemed appropriate.  We will call for admission for further management.          Final Clinical Impression(s) / ED Diagnoses Final diagnoses:  Allergic reaction, initial encounter  Syncope and collapse  Anaphylaxis, initial encounter  Hypokalemia  Hypocalcemia    Clinical Impression: 1. Allergic reaction, initial encounter   2. Syncope and collapse   3. Anaphylaxis, initial encounter   4. Hypokalemia   5. Hypocalcemia     Disposition: Admit  This note was prepared with assistance of Dragon voice recognition software. Occasional wrong-word or sound-a-like substitutions may have occurred due to the inherent limitations of voice recognition software.      Gia Lusher, Canary Brim, MD 12/02/21 [phone]

## 2021-12-02 NOTE — ED Triage Notes (Signed)
Pt BIBA from home. EMS was called for pt having possible seizure while in shower. Pt has no hx of seizure. Family heard noise of pt falling down and found pt prone on shower floor, stiff and unresponsive for a very short period. Pt came to,  and started developing red rash on chest. 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 Pt reported nausea and coughing.  Pt given:  .3 EPI x2 50mg  benadryl IV 4mg  Zofran IV 125 solu-medrol IV 5 mg Albuterol neb 500 NS  After benadryl and epi pts rash resolved. After albuterol cough resolved.  Pt placed in c-collar, with no c/o pain in head or neck  AOx4

## 2021-12-03 ENCOUNTER — Inpatient Hospital Stay (HOSPITAL_COMMUNITY): Payer: No Typology Code available for payment source

## 2021-12-03 ENCOUNTER — Inpatient Hospital Stay (HOSPITAL_COMMUNITY)
Admit: 2021-12-03 | Discharge: 2021-12-03 | Disposition: A | Discharge status: Home / Self Care | Payer: No Typology Code available for payment source | Attending: Internal Medicine | Admitting: Internal Medicine

## 2021-12-03 DIAGNOSIS — R251 Tremor, unspecified: Secondary | ICD-10-CM

## 2021-12-03 DIAGNOSIS — Z91013 Allergy to seafood: Secondary | ICD-10-CM

## 2021-12-03 DIAGNOSIS — F411 Generalized anxiety disorder: Secondary | ICD-10-CM | POA: Diagnosis not present

## 2021-12-03 DIAGNOSIS — T782XXA Anaphylactic shock, unspecified, initial encounter: Secondary | ICD-10-CM | POA: Diagnosis not present

## 2021-12-03 DIAGNOSIS — R569 Unspecified convulsions: Secondary | ICD-10-CM

## 2021-12-03 DIAGNOSIS — R55 Syncope and collapse: Secondary | ICD-10-CM

## 2021-12-03 DIAGNOSIS — S0990XA Unspecified injury of head, initial encounter: Secondary | ICD-10-CM

## 2021-12-03 LAB — COMPREHENSIVE METABOLIC PANEL
ALT: 14 U/L (ref 0–44)
ALT: 22 U/L (ref 0–44)
AST: 22 U/L (ref 15–41)
AST: 32 U/L (ref 15–41)
Albumin: 3.1 g/dL — ABNORMAL LOW (ref 3.5–5.0)
Albumin: 4.4 g/dL (ref 3.5–5.0)
Alkaline Phosphatase: 30 U/L — ABNORMAL LOW (ref 38–126)
Alkaline Phosphatase: 44 U/L (ref 38–126)
Anion gap: 3 — ABNORMAL LOW (ref 5–15)
Anion gap: 5 (ref 5–15)
BUN: 14 mg/dL (ref 6–20)
BUN: 11 mg/dL (ref 6–20)
CO2: 18 mmol/L — ABNORMAL LOW (ref 22–32)
CO2: 27 mmol/L (ref 22–32)
Calcium: 6.9 mg/dL — ABNORMAL LOW (ref 8.9–10.3)
Calcium: 9.1 mg/dL (ref 8.9–10.3)
Chloride: 123 mmol/L — ABNORMAL HIGH (ref 98–111)
Chloride: 110 mmol/L (ref 98–111)
Creatinine, Ser: 0.74 mg/dL (ref 0.61–1.24)
Creatinine, Ser: 0.95 mg/dL (ref 0.61–1.24)
GFR, Estimated: >60 mL/min (ref >60)
GFR, Estimated: >60 mL/min (ref >60)
Glucose, Bld: 102 mg/dL — ABNORMAL HIGH (ref 70–99)
Glucose, Bld: 109 mg/dL — ABNORMAL HIGH (ref 70–99)
Potassium: 4.9 mmol/L (ref 3.5–5.1)
Potassium: 4 mmol/L (ref 3.5–5.1)
Sodium: 144 mmol/L (ref 135–145)
Sodium: 142 mmol/L (ref 135–145)
Total Bilirubin: 0.8 mg/dL (ref 0.3–1.2)
Total Bilirubin: 0.8 mg/dL (ref 0.3–1.2)
Total Protein: 4.6 g/dL — ABNORMAL LOW (ref 6.5–8.1)
Total Protein: 6.7 g/dL (ref 6.5–8.1)

## 2021-12-03 LAB — CBC WITH DIFFERENTIAL/PLATELET
Abs Immature Granulocytes: 0.03 10*3/uL (ref 0.00–0.07)
Basophils Absolute: 0.1 10*3/uL (ref 0.0–0.1)
Basophils Relative: 0 %
Eosinophils Absolute: 0 10*3/uL (ref 0.0–0.5)
Eosinophils Relative: 0 %
HCT: 41 % (ref 39.0–52.0)
Hemoglobin: 14.2 g/dL (ref 13.0–17.0)
Immature Granulocytes: 0 %
Lymphocytes Relative: 15 %
Lymphs Abs: 1.8 10*3/uL (ref 0.7–4.0)
MCH: 29 pg (ref 26.0–34.0)
MCHC: 34.6 g/dL (ref 30.0–36.0)
MCV: 83.7 fL (ref 80.0–100.0)
Monocytes Absolute: 0.8 10*3/uL (ref 0.1–1.0)
Monocytes Relative: 7 %
Neutro Abs: 8.9 10*3/uL — ABNORMAL HIGH (ref 1.7–7.7)
Neutrophils Relative %: 78 %
Platelets: 206 10*3/uL (ref 150–400)
RBC: 4.9 MIL/uL (ref 4.22–5.81)
RDW: 12.9 % (ref 11.5–15.5)
WBC: 11.6 10*3/uL — ABNORMAL HIGH (ref 4.0–10.5)
nRBC: 0 % (ref 0.0–0.2)

## 2021-12-03 LAB — CBC
HCT: 38 % — ABNORMAL LOW (ref 39.0–52.0)
Hemoglobin: 13.1 g/dL (ref 13.0–17.0)
MCH: 28.9 pg (ref 26.0–34.0)
MCHC: 34.5 g/dL (ref 30.0–36.0)
MCV: 83.9 fL (ref 80.0–100.0)
Platelets: 193 10*3/uL (ref 150–400)
RBC: 4.53 MIL/uL (ref 4.22–5.81)
RDW: 12.8 % (ref 11.5–15.5)
WBC: 13.3 10*3/uL — ABNORMAL HIGH (ref 4.0–10.5)
nRBC: 0 % (ref 0.0–0.2)

## 2021-12-03 LAB — ECHOCARDIOGRAM COMPLETE
AR max vel: 1.86 cm2
AV Area VTI: 1.96 cm2
AV Area mean vel: 1.92 cm2
AV Mean grad: 5 mmHg
AV Peak grad: 8.8 mmHg
Ao pk vel: 1.48 m/s
Area-P 1/2: 5.66 cm2
Height: 72 in
S' Lateral: 2.7 cm
Weight: 2320 oz

## 2021-12-03 LAB — BASIC METABOLIC PANEL
Anion gap: 4 — ABNORMAL LOW (ref 5–15)
Anion gap: 5 (ref 5–15)
BUN: 11 mg/dL (ref 6–20)
BUN: 15 mg/dL (ref 6–20)
CO2: 19 mmol/L — ABNORMAL LOW (ref 22–32)
CO2: 29 mmol/L (ref 22–32)
Calcium: 8.2 mg/dL — ABNORMAL LOW (ref 8.9–10.3)
Calcium: 9.1 mg/dL (ref 8.9–10.3)
Chloride: 121 mmol/L — ABNORMAL HIGH (ref 98–111)
Chloride: 108 mmol/L (ref 98–111)
Creatinine, Ser: 0.56 mg/dL — ABNORMAL LOW (ref 0.61–1.24)
Creatinine, Ser: 1.14 mg/dL (ref 0.61–1.24)
GFR, Estimated: >60 mL/min (ref >60)
GFR, Estimated: >60 mL/min (ref >60)
Glucose, Bld: 96 mg/dL (ref 70–99)
Glucose, Bld: 107 mg/dL — ABNORMAL HIGH (ref 70–99)
Potassium: 2.7 mmol/L — CL (ref 3.5–5.1)
Potassium: 3.9 mmol/L (ref 3.5–5.1)
Sodium: 144 mmol/L (ref 135–145)
Sodium: 142 mmol/L (ref 135–145)

## 2021-12-03 LAB — CBG MONITORING, ED: Glucose-Capillary: 119 mg/dL — ABNORMAL HIGH (ref 70–99)

## 2021-12-03 LAB — MAGNESIUM: Magnesium: 2.1 mg/dL (ref 1.7–2.4)

## 2021-12-03 LAB — TSH: TSH: 0.337 u[IU]/mL — ABNORMAL LOW (ref 0.350–4.500)

## 2021-12-03 LAB — HIV ANTIBODY (ROUTINE TESTING W REFLEX): HIV Screen 4th Generation wRfx: NONREACTIVE

## 2021-12-03 LAB — PHOSPHORUS: Phosphorus: 3.2 mg/dL (ref 2.5–4.6)

## 2021-12-03 IMAGING — MR MR HEAD W/O CM
11 series · 48 of 48 positions shown · non-contrast
Comparison: CT head and cervical spine yesterday.

CLINICAL DATA: 26-year-old male with syncope, seizure.

EXAM:
MRI HEAD WITHOUT CONTRAST
TECHNIQUE: Multiplanar, multiecho pulse sequences of the brain and surrounding
structures were obtained without intravenous contrast.

[Series 5: DWI · axial · 3.0mm · 1.36mm/px · z∈[-81,+53]mm · 7 of 92 slices shown (1 of 4)]
[im 1/92]
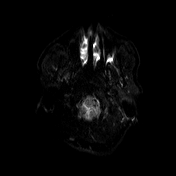
[im 16/92]
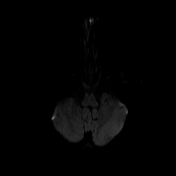
[im 31/92]
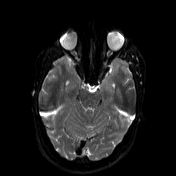
[im 46/92]
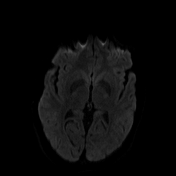
[im 61/92]
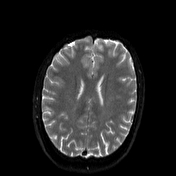
[im 76/92]
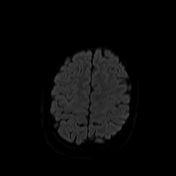
[im 92/92]
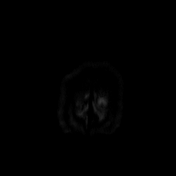

[Series 6: DWI · axial · 3.0mm · 1.36mm/px · z∈[-81,+53]mm · 3 of 44 slices shown (2 of 4)]
[im 1/44]
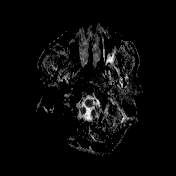
[im 22/44]
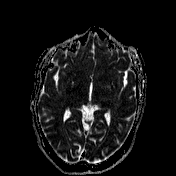
[im 44/44]
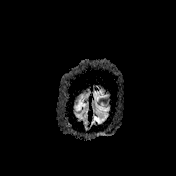

[Series 7: T1 · sagittal · 5.0mm · 0.75mm/px · 2 of 24 slices shown (1 of 2)]
[im 1/24]
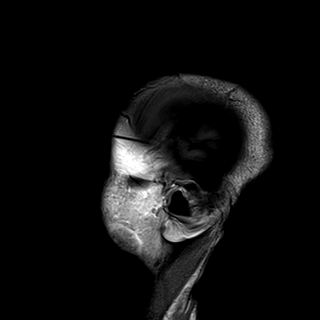
[im 24/24]
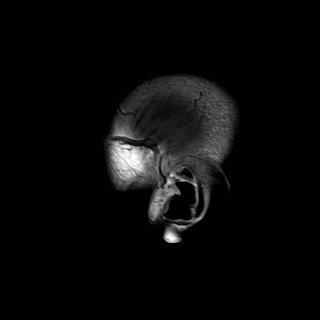

[Series 8: T2 · axial · 3.0mm · 0.57mm/px · z∈[-76,+57]mm · 3 of 46 slices shown (1 of 2)]
[im 1/46]
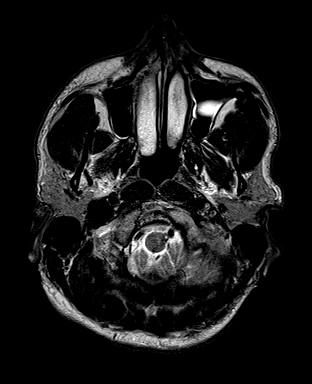
[im 23/46]
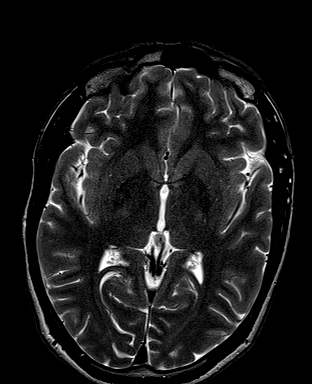
[im 46/46]
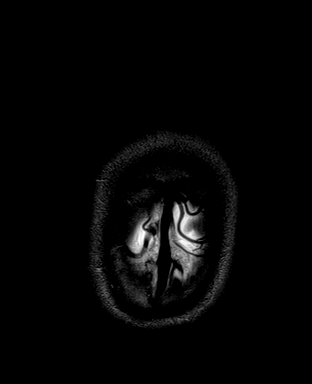

[Series 9: swi_images · axial · 3.0mm · 0.75mm/px · z∈[-98,+77]mm · 4 of 60 slices shown]
[im 1/60]
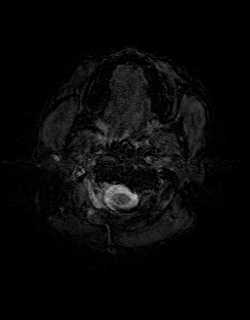
[im 20/60]
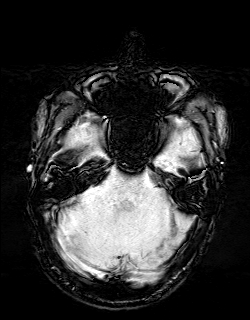
[im 40/60]
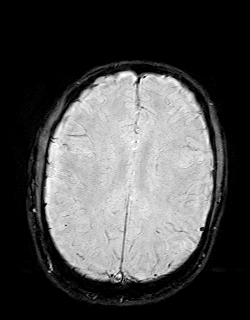
[im 60/60]
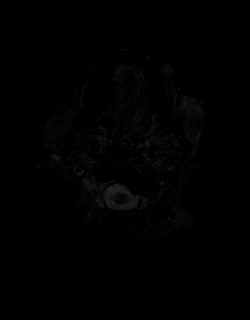

[Series 11: FLAIR · axial · 3.0mm · 0.75mm/px · z∈[-81,+64]mm · 4 of 50 slices shown (1 of 2)]
[im 1/50]
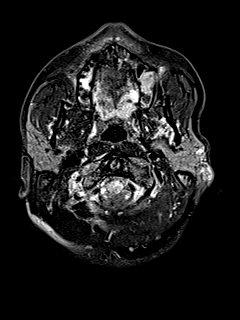
[im 17/50]
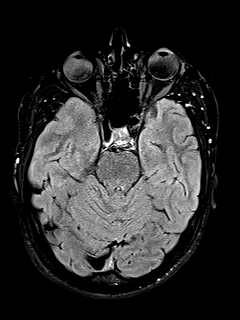
[im 33/50]
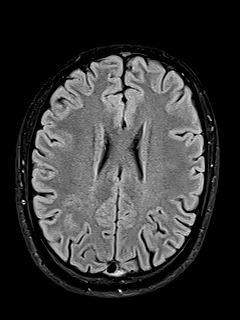
[im 50/50]
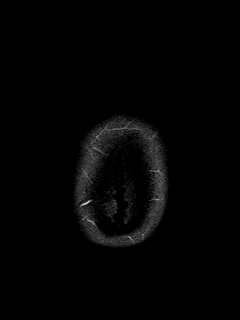

[Series 12: T1 · axial · 1.0mm · 0.94mm/px · z∈[-88,+69]mm · 12 of 160 slices shown (2 of 2)]
[im 1/160]
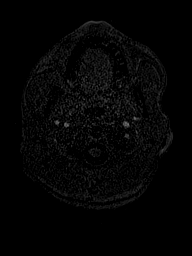
[im 15/160]
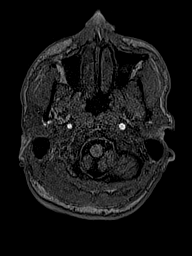
[im 29/160]
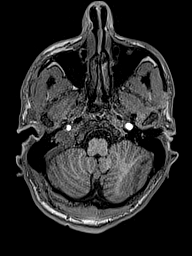
[im 44/160]
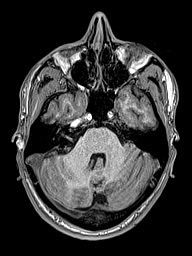
[im 58/160]
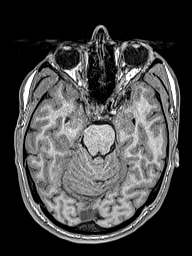
[im 73/160]
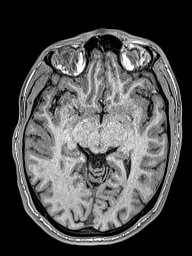
[im 87/160]
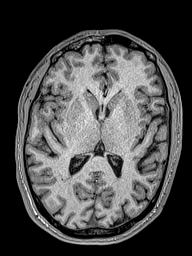
[im 102/160]
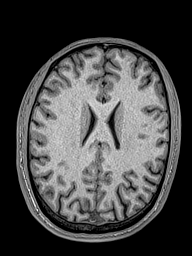
[im 116/160]
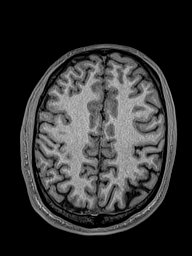
[im 131/160]
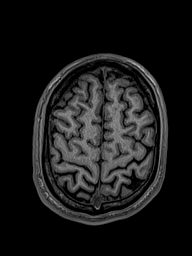
[im 145/160]
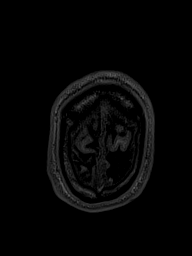
[im 160/160]
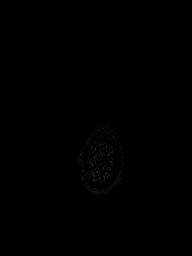

[Series 13: T2 · coronal · 3.0mm · 0.40mm/px · 3 of 34 slices shown (2 of 2)]
[im 1/34]
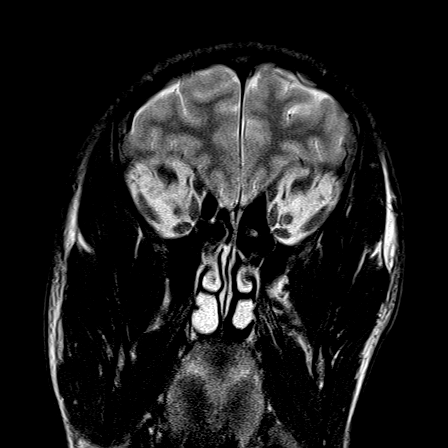
[im 17/34]
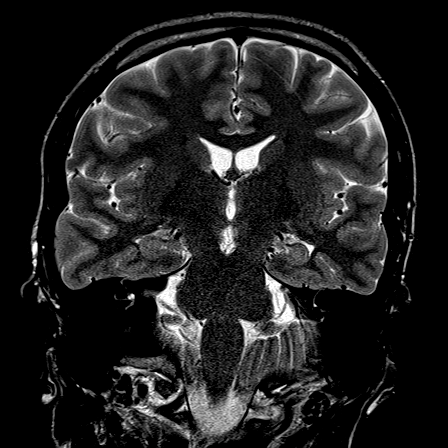
[im 34/34]
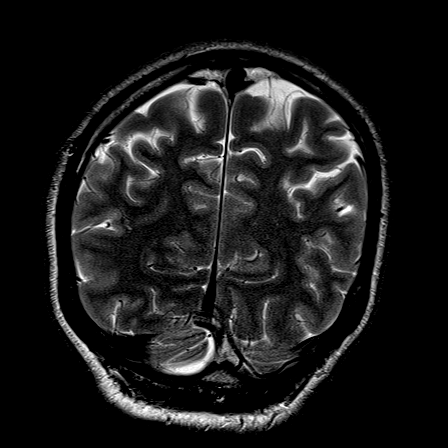

[Series 14: FLAIR · coronal · 3.0mm · 0.56mm/px · 3 of 34 slices shown (2 of 2)]
[im 1/34]
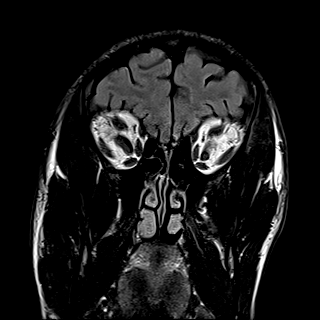
[im 17/34]
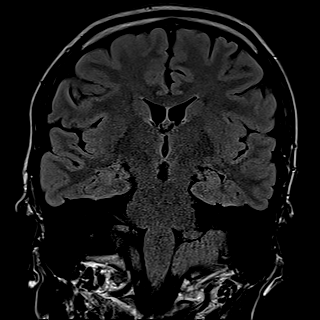
[im 34/34]
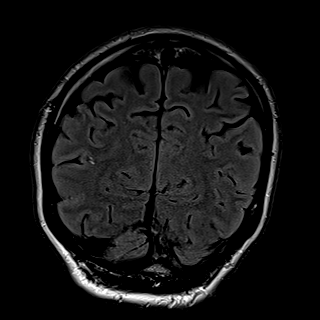

[Series 15: DWI · coronal · 5.0mm · 1.36mm/px · 5 of 64 slices shown (3 of 4)]
[im 1/64]
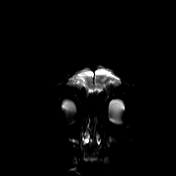
[im 16/64]
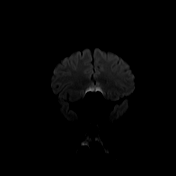
[im 32/64]
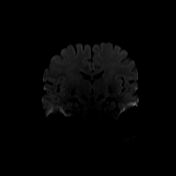
[im 48/64]
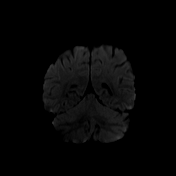
[im 64/64]
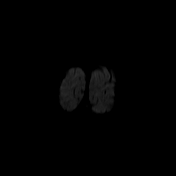

[Series 16: DWI · coronal · 5.0mm · 1.36mm/px · 2 of 32 slices shown (4 of 4)]
[im 1/32]
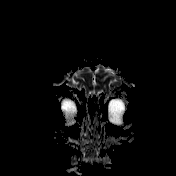
[im 32/32]
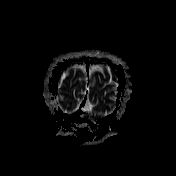

[48 of 48 positions shown; findings below may reference images not displayed]

FINDINGS: Brain: No restricted diffusion to suggest acute infarction. No
midline shift, mass effect, evidence of mass lesion,
ventriculomegaly, extra-axial collection or acute intracranial
hemorrhage. Cervicomedullary junction and pituitary are within
normal limits.

Hippocampal complexes appear symmetric and within normal limits.
Other mesial temporal lobe structures appear normal. Gray and white
matter signal is within normal limits throughout the brain. No
encephalomalacia or chronic cerebral blood products identified.

Vascular: Major intracranial vascular flow voids are preserved.

Skull and upper cervical spine: Negative. Visualized bone marrow
signal is within normal limits.

Sinuses/Orbits: Negative orbits. Small left maxillary sinus mucous
retention cysts.

Other: Mastoids are clear. Visible internal auditory structures
appear normal. Negative visible scalp and face.
IMPRESSION: Normal noncontrast MRI appearance of the brain.

## 2021-12-03 MED ORDER — CALCIUM GLUCONATE-NACL 1-0.675 GM/50ML-% IV SOLN
1.0000 g | Freq: Once | INTRAVENOUS | Status: AC
  Administered 2021-12-03: 1000 mg via INTRAVENOUS
  Filled 2021-12-03: qty 50

## 2021-12-03 MED ORDER — SODIUM BICARBONATE 8.4 % IV SOLN
INTRAVENOUS | Status: DC
  Filled 2021-12-03: qty 1000
  Filled 2021-12-03 (×2): qty 150

## 2021-12-03 MED ORDER — SODIUM BICARBONATE 650 MG PO TABS
650.0000 mg | ORAL_TABLET | Freq: Two times a day (BID) | ORAL | Status: DC
  Administered 2021-12-03 – 2021-12-04 (×3): 650 mg via ORAL
  Filled 2021-12-03 (×4): qty 1

## 2021-12-03 MED ORDER — POTASSIUM CHLORIDE CRYS ER 20 MEQ PO TBCR
40.0000 meq | EXTENDED_RELEASE_TABLET | Freq: Two times a day (BID) | ORAL | Status: AC
  Administered 2021-12-03 (×2): 40 meq via ORAL
  Filled 2021-12-03 (×2): qty 2

## 2021-12-03 NOTE — ED Notes (Signed)
Date and time results received: 12/03/21 12:35 PM   Test: BMP  Critical Value: K = 2.7  Name of Provider Notified: Dr. De Burrs  Orders Received? Or Actions Taken?: Actions Taken: See new orders.

## 2021-12-03 NOTE — ED Notes (Signed)
Patient returned back from MRI at this time.  °

## 2021-12-03 NOTE — Progress Notes (Signed)
EEG complete - results pending 

## 2021-12-03 NOTE — Consult Note (Signed)
NEUROLOGY CONSULTATION NOTE   Date of service: December 03, 2021 Patient Name: Danny Rich MRN:  [id number] DOB:  Mar 25, 1996 Reason for consult: "seizure vs. Syncopal episode" Requesting physician: "Dr. Marland Mcalpine" _ _ _   _ __   _ __ _ _  __ __   _ __   __ _  History of Present Illness   Danny Rich is a 26 y.o. male with PMH significant for  multiple food allergies who presents with an episode of possible anaphylactic shock followed by a seizure or syncopal episode.  Patient states that around 1100 yesterday, he took a shower.  He did not use any new bath products and had not eaten any new foods and was not exposed to any insects at that time.  He states that his bottle of body soap was fairly new and suspects that an ingredient may have been changed.  He states that he got out of the shower and the next thing he remembers is being surrounded by EMS personnel.  His mother states that she heard a thump and came to the bathroom to find patient face down on the floor shaking with a shivering motion.  This shaking lasted for about 30 seconds and was followed by rhythmic shaking of the right arm for another 30 seconds.  Patient was not incontinent of urine during this episode.  He was initially confused after waking up but quickly became oriented again.   ROS   Per HPI: all other systems reviewed and are negative  Past History   I have reviewed the following:  History reviewed. No pertinent past medical history. History reviewed. No pertinent surgical history. History reviewed. No pertinent family history. Social History   Socioeconomic History   Marital status: Single    Spouse name: Not on file   Number of children: Not on file   Years of education: Not on file   Highest education level: Not on file  Occupational History   Not on file  Tobacco Use   Smoking status: Never   Smokeless tobacco: Never  Substance and Sexual Activity   Alcohol use: Not on file   Drug use: Not on  file   Sexual activity: Not on file  Other Topics Concern   Not on file  Social History Narrative   Not on file   Social Determinants of Health   Financial Resource Strain: Not on file  Food Insecurity: Not on file  Transportation Needs: Not on file  Physical Activity: Not on file  Stress: Not on file  Social Connections: Not on file   Allergies  Allergen Reactions   Aspirin Anaphylaxis   Cashew Nut Oil Anaphylaxis    See note under tree nuts   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   Shellfish Allergy Anaphylaxis    Medications   (Not in a hospital admission)     Current Facility-Administered Medications:    ondansetron (ZOFRAN) tablet 4 mg, 4 mg, Oral, Q6H PRN **OR** ondansetron (ZOFRAN) injection 4 mg, 4 mg, Intravenous, Q6H PRN, Mikeal Hawthorne, Mohammad L, MD   potassium chloride SA (KLOR-CON M) CR tablet 40 mEq, 40 mEq, Oral, BID, Marguerita Merles Latif, DO, 40 mEq at 12/03/21 1245   sodium bicarbonate 150  mEq in dextrose 5 % 1,150 mL infusion, , Intravenous, Continuous, Marguerita Merles [place], [place], Last Rate: 100 mL/hr at 12/03/21 1121, New Bag at 12/03/21 1121   sodium bicarbonate tablet 650 mg, 650 mg, Oral, BID, Marguerita Merles Latif, DO, 650 mg at 12/03/21 1121   sodium chloride flush (NS) 0.9 % injection 3 mL, 3 mL, Intravenous, Q12H, Garba, Mohammad L, MD, 3 mL at 12/03/21 1122  Current Outpatient Medications:    acetaminophen (TYLENOL) 500 MG tablet, Take 1,000 mg by mouth daily as needed for headache., Disp: , Rfl:    vitamin C (ASCORBIC ACID) 500 MG tablet, Take 1,000 mg by mouth at bedtime., Disp: , Rfl:   Vitals   Vitals:   12/03/21 0920 12/03/21 1000 12/03/21 1100 12/03/21 1240  BP: 126/78 133/81 125/82 118/84  Pulse: 89 100 95 95   Resp: 16 16 16 15   Temp:      TempSrc:      SpO2: 98% 98% 97% 98%  Weight:      Height:         Body mass index is 19.67 kg/m.  Physical Exam   Physical Exam Gen: A&O x4, NAD HEENT: Atraumatic, normocephalic;mucous membranes moist; oropharynx clear, tongue without atrophy or fasciculations. Neck: Supple, trachea midline, redness to anterior neck Extrem: Nml bulk; no cyanosis, clubbing, or edema.  Neuro: MS: A&O x4. Follows multi-step commands.  Speech: fluid, nondysarthric, able to repeat CN:    I: Deferred   II,III: PERRLA, VFF by confrontation,    III,IV,VI: EOMI w/o nystagmus, no ptosis   V: Sensation intact from V1 to V3 to LT   VII: Eyelid closure was full.  Smile symmetric.   VIII: Hearing intact to voice   IX,X: Voice normal   XI: SCM/trap 5/5 bilat   XII: Tongue protrudes midline, no atrophy or fasciculations   Motor:   Normal bulk.  No tremor, rigidity or bradykinesia. No pronator drift.    Strength: Dlt Bic Tri WrE WrF FgS Gr HF KnF KnE PlF DoF    Left 5 5 5 5 5 5 5 5 5 5 5 5     Right 5 5 5 5 5 5 5 5 5 5 5 5     Sensory: Intact to light touch throughout. Symmetric. Propioception intact bilat.  No double-simultaneous extinction. Coordination:  Finger-to-nose, heel-to-shin, rapid alternating motions were intact. Reflexes:  2+ and symmetric throughout without clonus; toes down-going bilat Gait: deferred   Labs   CBC:  Recent Labs  Lab 12/02/21 1236 12/03/21 0435  WBC 15.3* 13.3*  NEUTROABS 14.1*  --   HGB 14.7 13.1  HCT 42.7 38.0*  MCV 83.6 83.9  PLT 183 193    Basic Metabolic Panel:  Lab Results  Component Value Date   NA 144 12/03/2021   K 2.7 (LL) 12/03/2021   CO2 19 (L) 12/03/2021   GLUCOSE 96 12/03/2021   BUN 11 12/03/2021   CREATININE 0.56 (L) 12/03/2021   CALCIUM 8.2 (L) 12/03/2021   GFRNONAA >60 12/03/2021   Lipid Panel: No results found for: "LDLCALC" HgbA1c: No results found for: "HGBA1C" Urine Drug Screen:     Component  Value Date/Time   LABOPIA NONE DETECTED 12/02/2021 1819   COCAINSCRNUR NONE DETECTED 12/02/2021 1819   LABBENZ NONE DETECTED 12/02/2021 1819   AMPHETMU NONE DETECTED 12/02/2021 1819   THCU NONE DETECTED 12/02/2021 1819   LABBARB NONE DETECTED 12/02/2021 1819    Alcohol Level No results found for: "ETH"  CT Head without contrast:  Normal head CT  MRI Brain Normal noncontrast MRI of brain, contrast scan pending  rEEG: pending  Impression   26 year old patient with possible seizure vs. Syncopal episode following apparent anaphylactic reaction to an unknown trigger.  Patient had an episode of shaking lasting approximately one minute after falling over in the bathroom. No rash noted on chest. Ddx includes first seizure or syncopal episode.    Recommendations  1- Brain MRI with contrast 2- EEG ______________________________________________________________________   Thank you for the opportunity to take part in the care of this patient. If you have any further questions, please contact the neurology consultation attending.  Note to be edited by MD Signed,  Cortney E Ernestina Columbia , MSN, AGACNP-BC [facility] See Amion for schedule and pager information 12/03/2021 1:56 PM    If 7pm- 7am, please page neurology on call as listed in AMION.   Attending Neurohospitalist Addendum Patient seen and examined with APP/Resident. Agree with the history and physical as documented above. Agree with the plan as documented, which I helped formulate. I have edited the note to reflect my findings and recommendations.  I have independently reviewed the chart, obtained history, review of systems and examined the patient.I have personally reviewed pertinent head/neck/spine imaging (CT/MRI). Please feel free to call with any questions.  Patient presented with event c/f convulsive syncope vs first time seizure. I am more concerned for seizure since it seemed that the motor activity evolved  into R sided rhythmic jerking and he had at least a few minutes of post-ictal confusion. There was some question about whether or not he could have had an allergic reaction to the soap he was using causing anaphylaxis. He did receive epi in the ambulance but he was not having trouble breathing before that so anaphylaxis does not fit the clinical history. MRI brain wo contrast unremarkable except for a v small T2/FLAIR hyperintensity in R frontal lobe that is nonspecific (personal review). MRI brain w contrast ordered for additional sequences with gad as MRI brain earlier today was done without contrast. rEEG was normal. If MRI brain w contrast shows no abnormalities, OK to discharge patient on no AEDs and outpatient neuro f/u (I will arrange neuro clinic f/u). Patient counseled not to drive x6 mos after last spell.  --  Bing Neighbors, MD [facility] [phone]  If 7pm- 7am, please page neurology on call as listed in AMION.

## 2021-12-03 NOTE — ED Notes (Addendum)
Instructed by Webb Silversmith NP to stop current NaCl/Kcl infusion and have repeat potassium drawn at 1000.

## 2021-12-03 NOTE — Progress Notes (Signed)
Echocardiogram 2D Echocardiogram has been performed.  Danny Rich 12/03/2021, 9:05 AM 

## 2021-12-03 NOTE — Progress Notes (Signed)
PROGRESS NOTE    Danny Rich  AYT:[id number] DOB: Aug 11, 1995 DOA: 12/02/2021 PCP: Jordan Hawks, PA-C   Brief Narrative:  No notes on file    Assessment and Plan: No notes have been filed under this hospital service. Service: Hospitalist   Syncope versus Seizure -Patient most likely had a syncopal episode based on presentation but family states that he was convulsing and found him shaking and then found his arm shaking.  No prior history of seizures.   -With severe hypokalemia and acute anaphylactic reaction he more than likely had a syncopal episode but given his shaking this is concerning.   -Seizure precautions -Neurology been consulted for further evaluation and recommendations.  -CT Head and Cervical Spine done and showed "Normal head CT. No acute cervical spine injury.  Minimal degenerative disc disease at the C3-4 level." -MRI Brain done and showed "Normal noncontrast MRI appearance of the brain" -Check EEG -TSH was 0.634 but now repeat was 0.337 -HIV was nonreactive -ECHO Done and pending read -Will get PT/OT to further evaluate and Treat -Patient was given 2 L of normal saline boluses and then placed on maintenance IV fluid hydration but does not been changed to sodium bicarbonate drip with 150 mill colons at 100 MLS per hour -We will need to continue monitor carefully and patient states that he may have had an anaphylactic reaction to a certain soap given that he has multiple allergies -Likely the patient did have a seizure and if is unclear if related to electrolyte abnormalities -If he did have a seizure seizure precautions were given: -SEIZURE PRECAUTIONS Per [facility] statutes, patients with seizures are not allowed to drive until they have been seizure-free for six months.   Use caution when using heavy equipment or power tools. Avoid working on ladders or at heights. Take showers instead of baths. Ensure the water temperature is not too high on the  home water heater. Do not go swimming alone. Do not lock yourself in a room alone (i.e. bathroom). When caring for infants or small children, sit down when holding, feeding, or changing them to minimize risk of injury to the child in the event you have a seizure. Maintain good sleep hygiene. Avoid alcohol.    If patient has another seizure, call 911 and bring them back to the ED if: A.  The seizure lasts longer than 5 minutes.      B.  The patient doesn't wake shortly after the seizure or has new problems such as difficulty seeing, speaking or moving following the seizure C.  The patient was injured during the seizure D.  The patient has a temperature over 102 F (39C) E.  The patient vomited during the seizure and now is having trouble breathing    Hypokalemia -No obvious cause.  Patient denies nausea vomiting or diarrhea.  He has not had any medications including diuretics.   -At this point we will replete potassium.  Check magnesium level. -Encourage patient to stay hydrated at home.   -If magnesium is low this may also play a role in possible syncope if he had arrhythmias. -Patient's potassium was 2.8 and then trended up to 4.9 but is now 2.7 likely making the 4.9 and a spurious result -Mag level was 2.2 -Replete with IV KCl 30 mEq of p.o. KCl 40 mEq twice daily x2 doses -Continue monitor and trend and replete as necessary -Repeat CMP in a.m.   Anaphylactic reaction -Patient appears to have had anaphylactic reaction tonight.   -  He is usually having reactions to the substance listed above.  Suspected that the soap that he is doing for shower may have caused at this time around.  He will avoid that.   -Received 2 EpiPen's in the EMS  -In the meantime monitor the patient.  Treat with steroids Benadryl as needed.  Also epinephrine.   Generalized Anxiety Disorder -Confirm home regimen and continue.  Add Ativan as needed   Leukocytosis  -Possibly demargination and dehydration and now  likely in the setting of possible seizure -WBC went from 15.3 -> 13.3. -Continue to monitor for signs and symptoms of infection; no infection currently noted and will hold off on antibiotics at this time -Repeat CBC in a.m.  Metabolic Acidosis -Possibly in the setting of seizure-like activity; now CO2 is 19, anion gap is 4, chloride level is 121 -Change fluids to add sodium bicarbonate and discontinued the normal saline plus the 40 mEq of Kcl -Started sodium bicarbonate tabs and sodium bicarbonate drip as above -Continue monitor and trend and repeat CMP in a.m.   Hypocalcemia -In the setting of hypoalbuminemia; corrected calcium is 7.6; repeat 8.2 is given 2 g of IV calcium gluconate -Continue monitor and trend and repeat CMP in a.m.  DVT prophylaxis: SCDs Start: 12/02/21 2320    Code Status: Full Code Family Communication: Discussed with his parents at bedside  Disposition Plan:  Level of care: Telemetry Status is: Inpatient Remains inpatient appropriate because: Continues to have a hypokalemia and needs further evaluation by neurology   Consultants:  Neurology  Procedures:  CT Head and Cervical Spine MRI Brain  EEG ECHO  Antimicrobials:  Anti-infectives (From admission, onward)    None       Subjective: Seen and examined at bedside and he feels closer to his baseline and feeling much better.  Denies any nausea or vomiting.  States that he was taking a very hot shower and passed out but did not history and stated that he was actually shaking and convulsing.  He denies any nausea vomiting and states that the nausea is improved.  Did hit his chin.  No lightheadedness or dizziness.  No other concerns or complaints this time.  Objective: Vitals:   12/03/21 0600 12/03/21 0800 12/03/21 0840 12/03/21 0920  BP: (!) 106/59 118/73 121/70 126/78  Pulse: 81 94 95 89  Resp: 18 18 18 16   Temp:      TempSrc:      SpO2: 98% 96% 100% 98%  Weight:      Height:         Intake/Output Summary (Last 24 hours) at 12/03/2021 0953 Last data filed at 12/03/2021 12/05/2021 Gross per 24 hour  Intake 2362.38 ml  Output 800 ml  Net 1562.38 ml   Filed Weights   12/02/21 1231  Weight: 65.8 kg   Examination: Physical Exam:  Constitutional: Thin Caucasian male currently no acute distress Respiratory: Diminished to auscultation bilaterally, no wheezing, rales, rhonchi or crackles. Normal respiratory effort and patient is not tachypenic. No accessory muscle use.  Unlabored breathing Cardiovascular: RRR, no murmurs / rubs / gallops. S1 and S2 auscultated. No extremity edema.  Abdomen: Soft, non-tender, non-distended. Bowel sounds positive.  GU: Deferred. Musculoskeletal: No clubbing / cyanosis of digits/nails. No joint deformity upper and lower extremities.  Skin: Has a bruise on his chin from where he fell Neurologic: CN 2-12 grossly intact with no focal deficits. Sensation intact in all 4 Extremities, DTR normal. Strength 5/5 in all 4. Romberg sign cerebellar  reflexes not assessed.  Psychiatric: Normal judgment and insight. Alert and oriented x 3. Normal mood and appropriate affect.   Data Reviewed: I have personally reviewed following labs and imaging studies  CBC: Recent Labs  Lab 12/02/21 1236 12/03/21 0435  WBC 15.3* 13.3*  NEUTROABS 14.1*  --   HGB 14.7 13.1  HCT 42.7 38.0*  MCV 83.6 83.9  PLT 183 193   Basic Metabolic Panel: Recent Labs  Lab 12/02/21 1236 12/02/21 1512 12/03/21 0435  NA 143 144 144  K 2.3* 2.8* 4.9  CL 121* 117* 123*  CO2 16* 21* 18*  GLUCOSE 146* 107* 102*  BUN 19 21* 14  CREATININE 0.74 0.84 0.74  CALCIUM 6.2* 7.7* 6.9*  MG  --  2.2  --    GFR: Estimated Creatinine Clearance: 131.4 mL/min (by C-G formula based on SCr of 0.74 mg/dL). Liver Function Tests: Recent Labs  Lab 12/02/21 1236 12/03/21 0435  AST 17 22  ALT 13 14  ALKPHOS 31* 30*  BILITOT 0.6 0.8  PROT 4.5* 4.6*  ALBUMIN 3.0* 3.1*   No results for  input(s): "LIPASE", "AMYLASE" in the last 168 hours. No results for input(s): "AMMONIA" in the last 168 hours. Coagulation Profile: No results for input(s): "INR", "PROTIME" in the last 168 hours. Cardiac Enzymes: No results for input(s): "CKTOTAL", "CKMB", "CKMBINDEX", "TROPONINI" in the last 168 hours. BNP (last 3 results) No results for input(s): "PROBNP" in the last 8760 hours. HbA1C: No results for input(s): "HGBA1C" in the last 72 hours. CBG: Recent Labs  Lab 12/02/21 1223 12/03/21 0435  GLUCAP 165* 119*   Lipid Profile: No results for input(s): "CHOL", "HDL", "LDLCALC", "TRIG", "CHOLHDL", "LDLDIRECT" in the last 72 hours. Thyroid Function Tests: Recent Labs    12/02/21 2347  TSH 0.337*   Anemia Panel: No results for input(s): "VITAMINB12", "FOLATE", "FERRITIN", "TIBC", "IRON", "RETICCTPCT" in the last 72 hours. Sepsis Labs: No results for input(s): "PROCALCITON", "LATICACIDVEN" in the last 168 hours.  No results found for this or any previous visit (from the past 240 hour(s)).   Radiology Studies: MR BRAIN WO CONTRAST  Result Date: 12/03/2021 CLINICAL DATA:  26 year old male with syncope, seizure. EXAM: MRI HEAD WITHOUT CONTRAST TECHNIQUE: Multiplanar, multiecho pulse sequences of the brain and surrounding structures were obtained without intravenous contrast. COMPARISON:  CT head and cervical spine yesterday. FINDINGS: Brain: No restricted diffusion to suggest acute infarction. No midline shift, mass effect, evidence of mass lesion, ventriculomegaly, extra-axial collection or acute intracranial hemorrhage. Cervicomedullary junction and pituitary are within normal limits. Hippocampal complexes appear symmetric and within normal limits. Other mesial temporal lobe structures appear normal. Gray and white matter signal is within normal limits throughout the brain. No encephalomalacia or chronic cerebral blood products identified. Vascular: Major intracranial vascular flow voids  are preserved. Skull and upper cervical spine: Negative. Visualized bone marrow signal is within normal limits. Sinuses/Orbits: Negative orbits. Small left maxillary sinus mucous retention cysts. Other: Mastoids are clear. Visible internal auditory structures appear normal. Negative visible scalp and face. IMPRESSION: Normal noncontrast MRI appearance of the brain. Electronically Signed   By: Odessa Fleming M.D.   On: 12/03/2021 07:10   CT HEAD WO CONTRAST ( )  Result Date: 12/02/2021 CLINICAL DATA:  Fall with possible seizure and head injury while in shower. EXAM: CT HEAD WITHOUT CONTRAST CT CERVICAL SPINE WITHOUT CONTRAST TECHNIQUE: Multidetector CT imaging of the head and cervical spine was performed following the standard protocol without intravenous contrast. Multiplanar CT image reconstructions of the cervical  spine were also generated. RADIATION DOSE REDUCTION: This exam was performed according to the departmental dose-optimization program which includes automated exposure control, adjustment of the mA and/or kV according to patient size and/or use of iterative reconstruction technique. COMPARISON:  None Available. FINDINGS: CT HEAD FINDINGS Brain: Ventricles, cisterns and other CSF spaces are normal. There is no mass, mass effect, shift of midline structures or acute hemorrhage. No evidence of acute infarction. Vascular: No hyperdense vessel or unexpected calcification. Skull: Normal. Negative for fracture or focal lesion. Sinuses/Orbits: No acute finding. Other: None. CT CERVICAL SPINE FINDINGS Alignment: Normal. Skull base and vertebrae: Vertebral body heights are normal. Atlantoaxial articulation is normal. No significant neural foraminal narrowing. No acute fracture. Soft tissues and spinal canal: Prevertebral soft tissues are normal. No significant spinal canal stenosis. Disc levels:  Minimal broad-based disc bulge at the C3-4 level. Upper chest: No acute findings. Other: None. IMPRESSION: 1. Normal head  CT. 2. No acute cervical spine injury. 3. 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 Electronically Signed   By: Elberta Fortis M.D.   On: 12/02/2021 14:21   CT Cervical Spine Wo Contrast  Result Date: 12/02/2021 CLINICAL DATA:  Fall with possible seizure and head injury while in shower. EXAM: CT HEAD WITHOUT CONTRAST CT CERVICAL SPINE WITHOUT CONTRAST TECHNIQUE: Multidetector CT imaging of the head and cervical spine was performed following the standard protocol without intravenous contrast. Multiplanar CT image reconstructions of the cervical spine were also generated. RADIATION DOSE REDUCTION: This exam was performed according to the departmental dose-optimization program which includes automated exposure control, adjustment of the mA and/or kV according to patient size and/or use of iterative reconstruction technique. COMPARISON:  None Available. FINDINGS: CT HEAD FINDINGS Brain: Ventricles, cisterns and other CSF spaces are normal. There is no mass, mass effect, shift of midline structures or acute hemorrhage. No evidence of acute infarction. Vascular: No hyperdense vessel or unexpected calcification. Skull: Normal. Negative for fracture or focal lesion. Sinuses/Orbits: No acute finding. Other: None. CT CERVICAL SPINE FINDINGS Alignment: Normal. Skull base and vertebrae: Vertebral body heights are normal. Atlantoaxial articulation is normal. No significant neural foraminal narrowing. No acute fracture. Soft tissues and spinal canal: Prevertebral soft tissues are normal. No significant spinal canal stenosis. Disc levels:  Minimal broad-based disc bulge at the C3-4 level. Upper chest: No acute findings. Other: None. IMPRESSION: 1. Normal head CT. 2. No acute cervical spine injury. 3. 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 Electronically Signed   By: Elberta Fortis M.D.   On: 12/02/2021 14:21   DG Chest Portable 1 View  Result Date: 12/02/2021 CLINICAL DATA:  Fall with possible  seizure in shower. EXAM: PORTABLE CHEST 1 VIEW COMPARISON:  None Available. FINDINGS: Lungs are adequately inflated without consolidation, effusion or pneumothorax. Cardiomediastinal silhouette is normal. No acute fracture. IMPRESSION: No acute findings. Electronically Signed   By: Elberta Fortis M.D.   On: 12/02/2021 14:03    Scheduled Meds:  sodium chloride flush  3 mL Intravenous Q12H   Continuous Infusions:  0.9 % NaCl with KCl 40 mEq / L Stopped (12/03/21 0618)   calcium gluconate 1,000 mg (12/03/21 0944)    LOS: 1 day   Marguerita Merles, DO [facility] Available via Epic secure chat 7am-7pm After these hours, please refer to coverage provider listed on [email or web address] 12/03/2021, 9:53 AM

## 2021-12-03 NOTE — ED Notes (Signed)
Contacting floor coverage hospitalist in regards to pt's morning CMP revealing potassium levels WNL, and action on pt's current potassium infusion.

## 2021-12-03 NOTE — ED Notes (Signed)
Pt transported to MRI 

## 2021-12-03 NOTE — Procedures (Signed)
Patient Name: Danny Rich  MRN: [id number]  Epilepsy Attending: Charlsie Quest  Referring Physician/Provider: Merlene Laughter, DO  Date: 12/03/2021 Duration: 22.19 mins  Patient history: 26yo M with syncope. EEG to evaluate for seizure.  Level of alertness: Awake, drowsy  AEDs during EEG study: None  Technical aspects: This EEG study was done with scalp electrodes positioned according to the 10-20 International system of electrode placement. Electrical activity was acquired at a sampling rate of 500Hz  and reviewed with a high frequency filter of 70Hz  and a low frequency filter of 1Hz . EEG data were recorded continuously and digitally stored.   Description: The posterior dominant rhythm consists of 9 Hz activity of moderate voltage (25-35 uV) seen predominantly in posterior head regions, symmetric and reactive to eye opening and eye closing. Drowsiness was characterized by attenuation of the posterior background rhythm. Hyperventilation and photic stimulation were not performed.     IMPRESSION: This study is within normal limits. No seizures or epileptiform discharges were seen throughout the recording.  Danny Rich 

## 2021-12-04 ENCOUNTER — Inpatient Hospital Stay (HOSPITAL_COMMUNITY): Payer: No Typology Code available for payment source

## 2021-12-04 DIAGNOSIS — Z91013 Allergy to seafood: Secondary | ICD-10-CM | POA: Diagnosis not present

## 2021-12-04 DIAGNOSIS — E876 Hypokalemia: Secondary | ICD-10-CM | POA: Diagnosis not present

## 2021-12-04 DIAGNOSIS — F411 Generalized anxiety disorder: Secondary | ICD-10-CM | POA: Diagnosis not present

## 2021-12-04 LAB — COMPREHENSIVE METABOLIC PANEL
ALT: 22 U/L (ref 0–44)
AST: 27 U/L (ref 15–41)
Albumin: 3.9 g/dL (ref 3.5–5.0)
Alkaline Phosphatase: 33 U/L — ABNORMAL LOW (ref 38–126)
Anion gap: 8 (ref 5–15)
BUN: 18 mg/dL (ref 6–20)
CO2: 27 mmol/L (ref 22–32)
Calcium: 8.7 mg/dL — ABNORMAL LOW (ref 8.9–10.3)
Chloride: 107 mmol/L (ref 98–111)
Creatinine, Ser: 0.91 mg/dL (ref 0.61–1.24)
GFR, Estimated: >60 mL/min (ref >60)
Glucose, Bld: 106 mg/dL — ABNORMAL HIGH (ref 70–99)
Potassium: 4 mmol/L (ref 3.5–5.1)
Sodium: 142 mmol/L (ref 135–145)
Total Bilirubin: 0.9 mg/dL (ref 0.3–1.2)
Total Protein: 5.9 g/dL — ABNORMAL LOW (ref 6.5–8.1)

## 2021-12-04 LAB — CBC WITH DIFFERENTIAL/PLATELET
Abs Immature Granulocytes: 0.02 10*3/uL (ref 0.00–0.07)
Basophils Absolute: 0.1 10*3/uL (ref 0.0–0.1)
Basophils Relative: 1 %
Eosinophils Absolute: 0.2 10*3/uL (ref 0.0–0.5)
Eosinophils Relative: 2 %
HCT: 39 % (ref 39.0–52.0)
Hemoglobin: 13.4 g/dL (ref 13.0–17.0)
Immature Granulocytes: 0 %
Lymphocytes Relative: 34 %
Lymphs Abs: 2.8 10*3/uL (ref 0.7–4.0)
MCH: 28.9 pg (ref 26.0–34.0)
MCHC: 34.4 g/dL (ref 30.0–36.0)
MCV: 84.2 fL (ref 80.0–100.0)
Monocytes Absolute: 0.7 10*3/uL (ref 0.1–1.0)
Monocytes Relative: 8 %
Neutro Abs: 4.7 10*3/uL (ref 1.7–7.7)
Neutrophils Relative %: 55 %
Platelets: 190 10*3/uL (ref 150–400)
RBC: 4.63 MIL/uL (ref 4.22–5.81)
RDW: 13 % (ref 11.5–15.5)
WBC: 8.4 10*3/uL (ref 4.0–10.5)
nRBC: 0 % (ref 0.0–0.2)

## 2021-12-04 LAB — GLUCOSE, CAPILLARY
Glucose-Capillary: 103 mg/dL — ABNORMAL HIGH (ref 70–99)
Glucose-Capillary: 100 mg/dL — ABNORMAL HIGH (ref 70–99)

## 2021-12-04 LAB — BASIC METABOLIC PANEL
Anion gap: 7 (ref 5–15)
BUN: 17 mg/dL (ref 6–20)
CO2: 27 mmol/L (ref 22–32)
Calcium: 8.5 mg/dL — ABNORMAL LOW (ref 8.9–10.3)
Chloride: 107 mmol/L (ref 98–111)
Creatinine, Ser: 0.88 mg/dL (ref 0.61–1.24)
GFR, Estimated: >60 mL/min (ref >60)
Glucose, Bld: 105 mg/dL — ABNORMAL HIGH (ref 70–99)
Potassium: 4 mmol/L (ref 3.5–5.1)
Sodium: 141 mmol/L (ref 135–145)

## 2021-12-04 LAB — MAGNESIUM: Magnesium: 2.2 mg/dL (ref 1.7–2.4)

## 2021-12-04 LAB — PHOSPHORUS: Phosphorus: 4.3 mg/dL (ref 2.5–4.6)

## 2021-12-04 IMAGING — MR MR HEAD W/ CM
6 series · 48 of 48 positions shown · IV contrast (gadavist)
Comparison: None Available.

CLINICAL DATA: Seizure, new-onset, no history of trauma.

EXAM:
MRI HEAD WITH CONTRAST
TECHNIQUE: Multiplanar, multiecho pulse sequences of the brain and surrounding
structures were obtained with intravenous contrast.
CONTRAST:  6mL GADAVIST GADOBUTROL 1 MMOL/ML IV SOLN

[Series 5: T2 post-contrast · coronal · 5.0mm · 0.57mm/px · 4 of 28 slices shown]
[im 1/28]
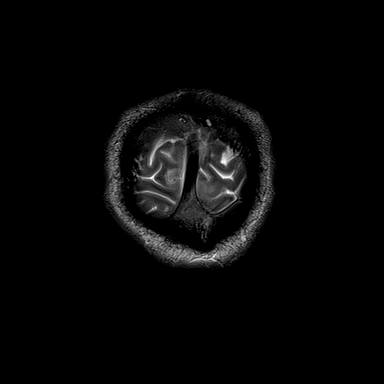
[im 10/28]
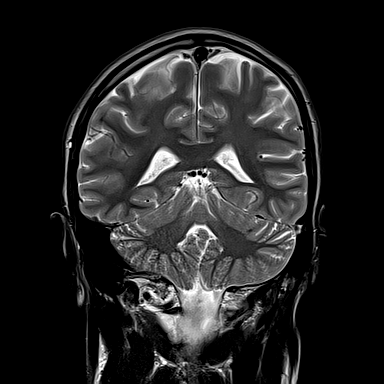
[im 19/28]
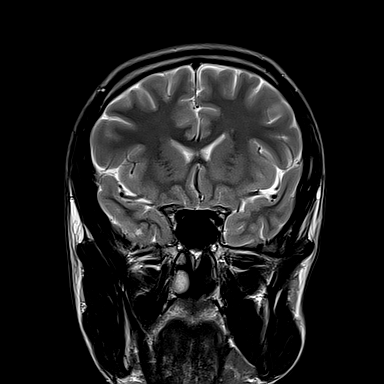
[im 28/28]
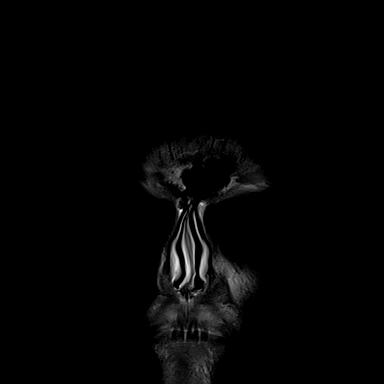

[Series 6: T1 post-contrast · axial · 1.0mm · 0.94mm/px · z∈[-69,+69]mm · 24 of 144 slices shown (1 of 3)]
[im 1/144]
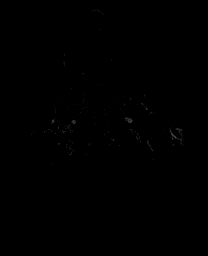
[im 7/144]
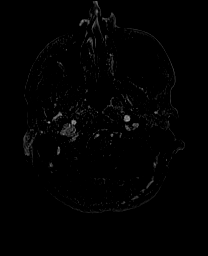
[im 13/144]
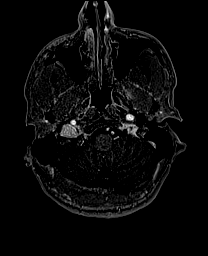
[im 19/144]
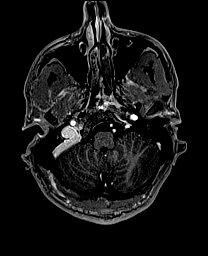
[im 25/144]
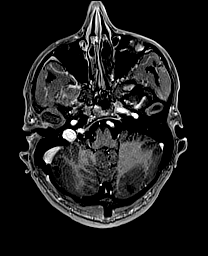
[im 32/144]
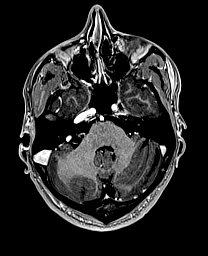
[im 38/144]
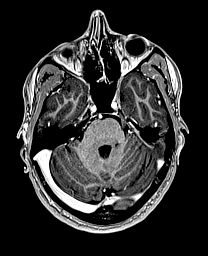
[im 44/144]
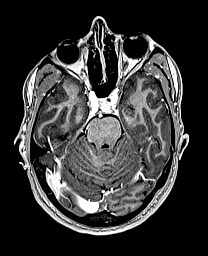
[im 50/144]
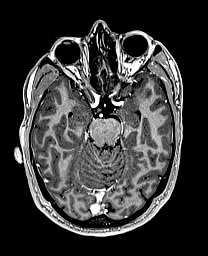
[im 56/144]
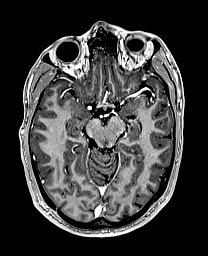
[im 63/144]
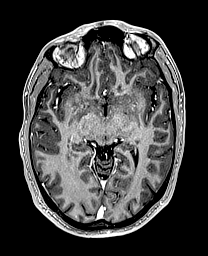
[im 69/144]
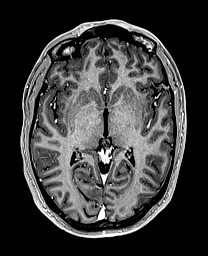
[im 75/144]
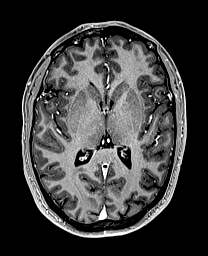
[im 81/144]
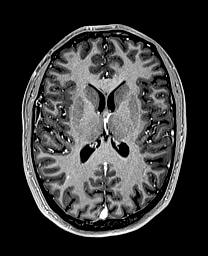
[im 88/144]
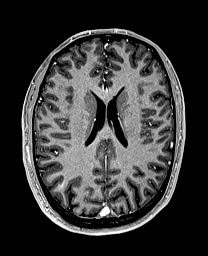
[im 94/144]
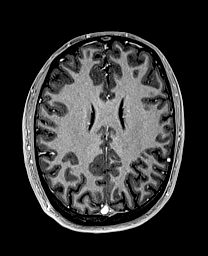
[im 100/144]
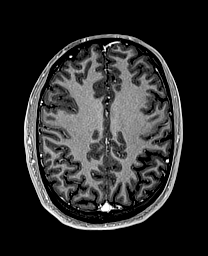
[im 106/144]
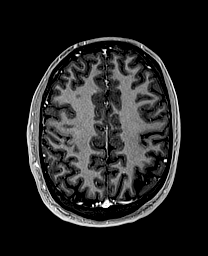
[im 112/144]
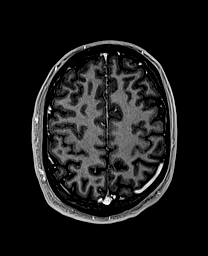
[im 119/144]
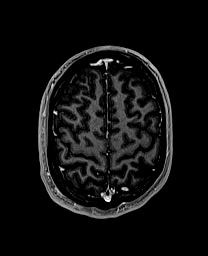
[im 125/144]
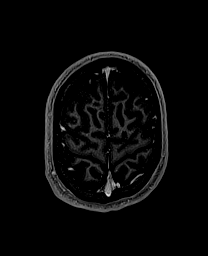
[im 131/144]
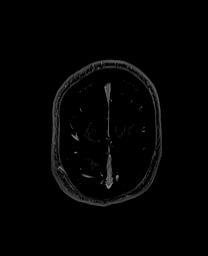
[im 137/144]
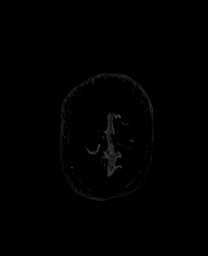
[im 144/144]
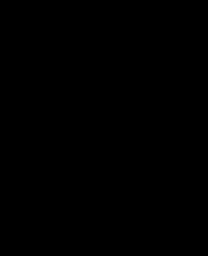

[Series 7: T1 · sagittal · 4.0mm · 0.94mm/px · 6 of 33 slices shown (1 of 2)]
[im 1/33]
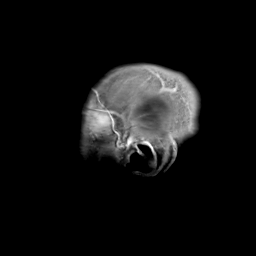
[im 7/33]
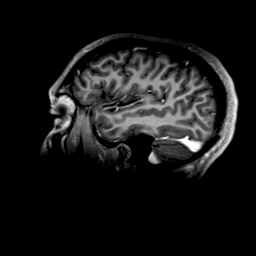
[im 13/33]
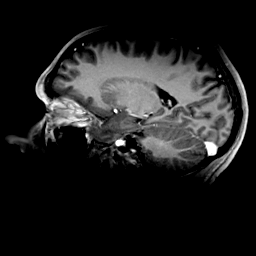
[im 20/33]
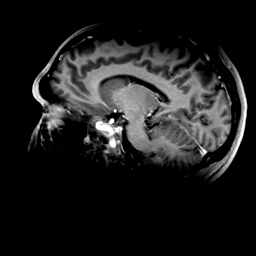
[im 26/33]
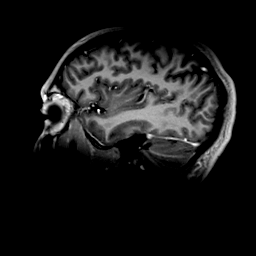
[im 33/33]
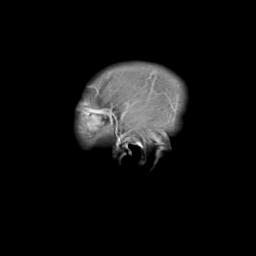

[Series 8: T1 · coronal · 4.0mm · 0.94mm/px · 5 of 30 slices shown (2 of 2)]
[im 1/30]
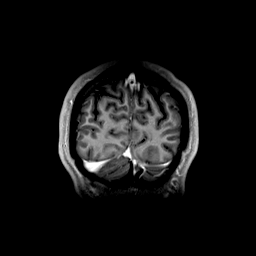
[im 8/30]
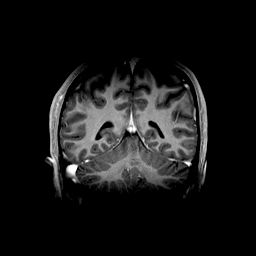
[im 15/30]
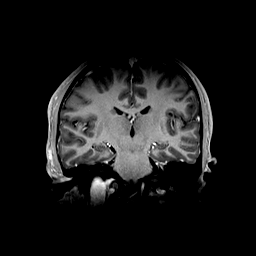
[im 22/30]
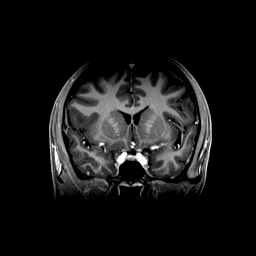
[im 30/30]
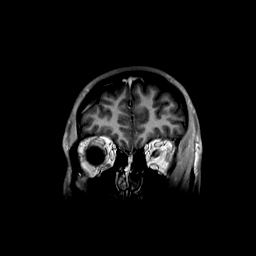

[Series 9: T1 post-contrast · coronal · 5.0mm · 0.43mm/px · 5 of 28 slices shown (2 of 3)]
[im 1/28]
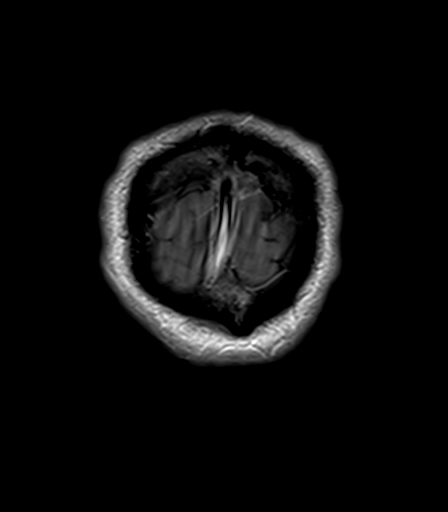
[im 7/28]
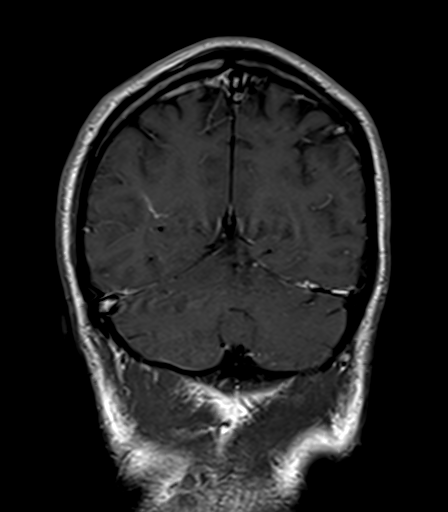
[im 14/28]
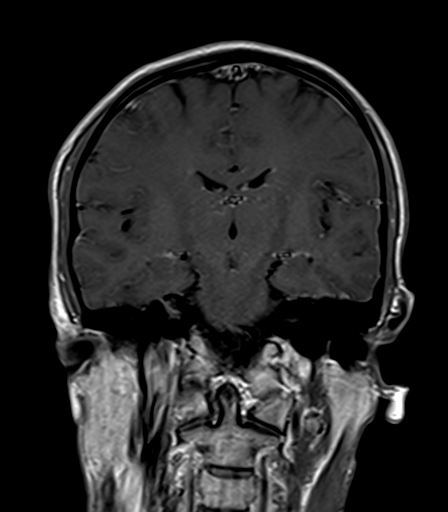
[im 21/28]
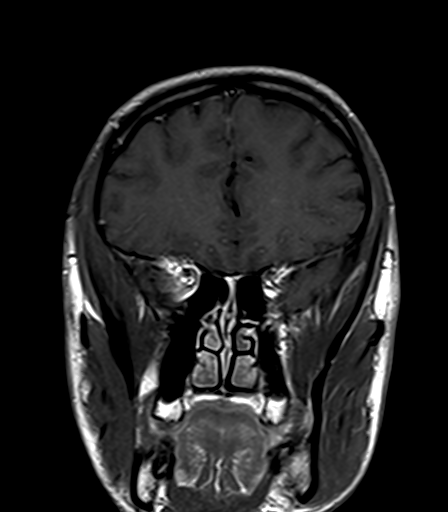
[im 28/28]
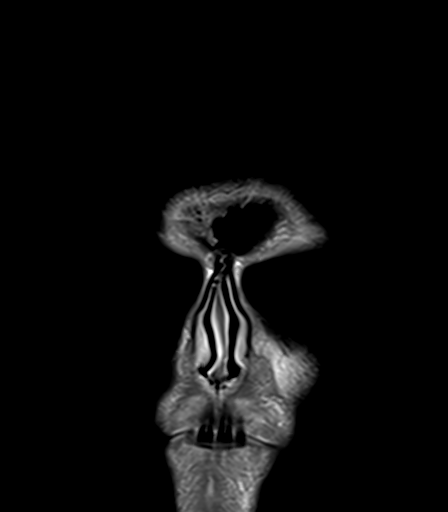

[Series 10: T1 post-contrast · sagittal · 5.0mm · 0.75mm/px · 4 of 24 slices shown (3 of 3)]
[im 1/24]
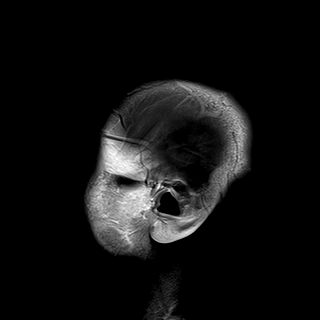
[im 8/24]
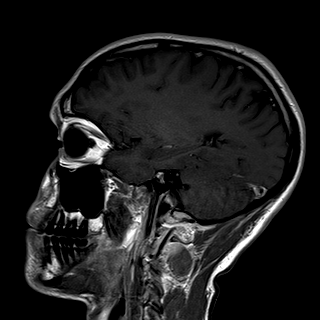
[im 16/24]
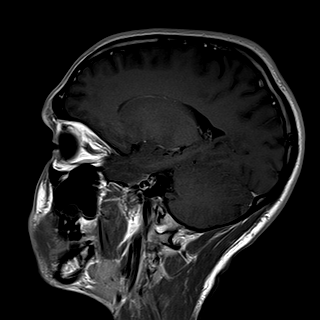
[im 24/24]
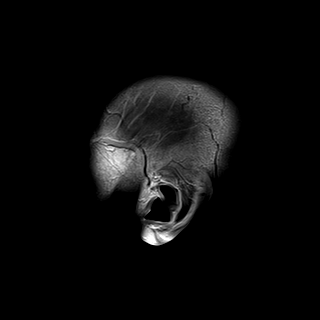

[48 of 48 positions shown; findings below may reference images not displayed]

FINDINGS: Postcontrast images of the brain show no evidence of abnormal
contrast enhancement. Incidental small developmental venous anomaly
in the anterior right parietal lobe. Vascular structures shows
normal contrast enhancement.
IMPRESSION: Unremarkable postcontrast images of the brain.

## 2021-12-04 MED ORDER — GADOBUTROL 1 MMOL/ML IV SOLN
6.0000 mL | Freq: Once | INTRAVENOUS | Status: AC | PRN
  Administered 2021-12-04: 6 mL via INTRAVENOUS

## 2021-12-04 NOTE — Hospital Course (Signed)
HPI per Dr. [name] is a 26 y.o. 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  Patient has been careful what he uses including his soap and other products due to his history of anaphylaxis.  He was apparently in the shower when mom heard a loud sound.  The last thing he remembered was him taking a shower with his cell.  When EMS arrived patient was having significant rashes all over with swollen face and side of early anaphylaxis.  He was noted to have passed out and also having some seizure-like activities.  On arrival in the ER patient has improvement fully awake.  He received epinephrine, Benadryl, Zofran, Solu-Medrol and albuterol nebs.  So far the rashes have resolved.  He did have some cough initially which also has resolved apparently after treatment.  He was placed on E. coli brought to the ER.  Patient states he has used the same type of soap for a while.  The.  He passed out was brief with no postictal symptoms.  Additionally patient was found to have potassium of 2.3 as well as hypocalcemia.  He has been eating and drinking adequately.  He drinks mostly Water.  Urine drug screen negative for any drugs.  Patient is being admitted to the hospital for further evaluation and treatment.  **Interim history Further work-up was done and patient was suspected to have a seizure based on his clinical presentation.  Neurology was consulted and an MRI of the brain was done without contrast.  EEG was done and showed no evidence of seizure activity but neurology evaluated and felt he did have a seizure and recommended MRI brain with contrast which was relatively normal.  Neurology recommended no AEDs but recommended the patient not to drive for 6 months.  Patient will have outpatient follow-up with  neurology as well as PCP within 1 to 2 weeks

## 2021-12-04 NOTE — Progress Notes (Signed)
OT Cancellation Note  Patient Details Name: Danny Rich MRN: [id number] DOB: 08-Jan-1996   Cancelled Treatment:    Reason Eval/Treat Not Completed: OT screened, no needs identified, will sign off Patient and mother in room at this time reporting patient is at [facility] for ADLs at this time. Patient reported taking himself to the bathroom in room with No AD. OT to sign off at this time. Thank you for this referral.   Sharyn Blitz OTR/L, MS Acute Rehabilitation Department Office# [phone] Pager# [phone]  12/04/2021, 9:02 AM

## 2021-12-04 NOTE — Evaluation (Signed)
Physical Therapy Evaluation Patient Details Name: Danny Rich MRN: [id number] DOB: 12-12-1995 Today's Date: 12/04/2021  History of Present Illness  26 y.o. 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 Dx of syncope vs seizure, hypokalemia. No acute findings with CT and MRI.  EEG normal.  Clinical Impression  Pt is mobilizing well independently. He ambulated 280' without an assistive device, no loss of balance, no dizziness. Pt was encouraged to walk independently in the halls TID to minimize deconditioning during hospitalization. He is ready to DC home from a PT standpoint. No further PT indicated, will sign off.         Recommendations for follow up therapy are one component of a multi-disciplinary discharge planning process, led by the attending physician.  Recommendations may be updated based on patient status, additional functional criteria and insurance authorization.  Follow Up Recommendations No PT follow up    Assistance Recommended at Discharge None  Patient can return home with the following       Equipment Recommendations None recommended by PT  Recommendations for Other Services       Functional Status Assessment Patient has not had a recent decline in their functional status     Precautions / Restrictions Precautions Precautions: None Restrictions Weight Bearing Restrictions: No      Mobility  Bed Mobility Overal bed mobility: Independent                  Transfers Overall transfer level: Independent                      Ambulation/Gait Ambulation/Gait assistance: Independent Gait Distance (Feet): 280 Feet Assistive device: None Gait Pattern/deviations: WFL(Within Functional Limits) Gait velocity: WNL     General Gait Details: steady, no loss of  balance, denied dizziness  Stairs            Wheelchair Mobility    Modified Rankin (Stroke Patients Only)       Balance Overall balance assessment: Independent                                           Pertinent Vitals/Pain Pain Assessment Pain Assessment: No/denies pain    Home Living Family/patient expects to be discharged to:: Private residence Living Arrangements: Parent Available Help at Discharge: Family             Home Equipment: None      Prior Function Prior Level of Function : Independent/Modified Independent;Driving             Mobility Comments: Independent without assistive device, no h/o falls in past 6 months. Drives. 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 ADLs Comments: independent     Higher education careers adviser        Extremity/Trunk Assessment   Upper Extremity Assessment Upper Extremity Assessment: Overall WFL for tasks assessed    Lower Extremity Assessment Lower Extremity Assessment: Overall WFL for tasks assessed    Cervical / Trunk Assessment Cervical / Trunk Assessment: Normal  Communication   Communication: No difficulties  Cognition Arousal/Alertness: Awake/alert Behavior During Therapy: WFL for tasks assessed/performed Overall Cognitive Status: Within Functional Limits for tasks assessed  General Comments      Exercises     Assessment/Plan    PT Assessment Patient does not need any further PT services  PT Problem List         PT Treatment Interventions      PT Goals (Current goals can be found in the Care Plan section)  Acute Rehab PT Goals Patient Stated Goal: return to work, play with his dog PT Goal Formulation: All assessment and education complete, DC therapy    Frequency       Co-evaluation               AM-PAC PT "6 Clicks" Mobility  Outcome Measure Help needed turning from your back to your side  while in a flat bed without using bedrails?: None Help needed moving from lying on your back to sitting on the side of a flat bed without using bedrails?: None Help needed moving to and from a bed to a chair (including a wheelchair)?: None Help needed standing up from a chair using your arms (e.g., wheelchair or bedside chair)?: None Help needed to walk in hospital room?: None Help needed climbing 3-5 steps with a railing? : None 6 Click Score: 24    End of Session   Activity Tolerance: Patient tolerated treatment well Patient left: in chair;with call bell/phone within reach;with family/visitor present Nurse Communication: Mobility status      Time: 1594-5859 PT Time Calculation (min) (ACUTE ONLY): 9 min   Charges:   PT Evaluation $PT Eval Low Complexity: 1 Low          Tamala Ser PT 12/04/2021  Acute Rehabilitation Services Pager [phone] Office [phone]

## 2021-12-04 NOTE — Discharge Summary (Signed)
Physician Discharge Summary   Patient: Danny Rich MRN: [id number] DOB: 10-24-1995  Admit date:     12/02/2021  Discharge date: 12/04/21  Discharge Physician: Marguerita Merles, DO   PCP: Jordan Hawks, PA-C   Recommendations at discharge:   Follow-up with PCP within 1 to 2 weeks and repeat CBC, CMP, mag, Phos within 1 week Follow-up with neurology in outpatient setting and adhere to the [facility] restrictions of no driving for 6 months given suspected seizure  Discharge Diagnoses: Principal Problem:   Syncope Active Problems:   Allergy to shellfish   GAD (generalized anxiety disorder)   Hypokalemia   Leucocytosis   Anaphylactic reaction   Hypocalcemia  Resolved Problems:   * No resolved hospital problems. [facility] Course: HPI per Dr. [name] is a 26 y.o. 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  Patient has been careful what he uses including his soap and other products due to his history of anaphylaxis.  He was apparently in the shower when mom heard a loud sound.  The last thing he remembered was him taking a shower with his cell.  When EMS arrived patient was having significant rashes all over with swollen face and side of early anaphylaxis.  He was noted to have passed out and also having some seizure-like activities.  On arrival in the ER patient has improvement fully awake.  He received epinephrine, Benadryl, Zofran, Solu-Medrol and albuterol nebs.  So far the rashes have resolved.  He did have some cough initially which also has resolved apparently after treatment.  He was placed on E. coli brought to the ER.  Patient states he has used the same type of soap for a while.  The.  He passed out was brief with no postictal symptoms.  Additionally  patient was found to have potassium of 2.3 as well as hypocalcemia.  He has been eating and drinking adequately.  He drinks mostly Water.  Urine drug screen negative for any drugs.  Patient is being admitted to the hospital for further evaluation and treatment.  **Interim history Further work-up was done and patient was suspected to have a seizure based on his clinical presentation.  Neurology was consulted and an MRI of the brain was done without contrast.  EEG was done and showed no evidence of seizure activity but neurology evaluated and felt he did have a seizure and recommended MRI brain with contrast which was relatively normal.  Neurology recommended no AEDs but recommended the patient not to drive for 6 months.  Patient will have outpatient follow-up with neurology as well as PCP within 1 to 2 weeks  Assessment and Plan:  Suspected Seizure; Concern for Convulsive Syncope but more likley Seizure -Patient was suspected to have syncopal episode based on presentation but family states that he was convulsing and found him shaking and then found his arm shaking.  No prior history of seizures.;  Neurology evaluated and they were concerned for seizure given it seemed that the motor activity evolved into a right-sided rhythmic jerking that he had at least a few minutes of postictal confusion -With severe hypokalemia and acute anaphylactic reaction he more than likely had a syncopal episode but given his shaking this is concerning.   -Seizure precautions -Neurology been consulted for further evaluation and recommendations.  -  CT Head and Cervical Spine done and showed "Normal head CT. No acute cervical spine injury.  Minimal degenerative disc disease at the C3-4 level." -MRI Brain done and showed "Normal noncontrast MRI appearance of the brain" -MRI brain with contrast done and showed "Unremarkable postcontrast images of the brain" -Check EEG and showed -TSH was 0.634 but now repeat was 0.337 -HIV was  nonreactive -ECHO Done and normal as it showed "Left Ventricle: Left ventricular ejection fraction, by estimation, is 60 to 65%. The left ventricle has  normal function. The left ventricle has no regional wall motion  abnormalities. The left ventricular internal cavity  size was normal in size. There is  no left ventricular hypertrophy. Left ventricular diastolic parameters were normal. Right Ventricle: The right ventricular size is normal. No increase in right ventricular wall thickness. Right ventricular systolic function is normal." -Will get PT/OT to further evaluate and Treat and no follow-up -Patient was given 2 L of normal saline boluses and then placed on maintenance IV fluid hydration but does not been changed to sodium bicarbonate drip with 150 mill colons at 100 MLS per hour -We will need to continue monitor carefully and patient states that he may have had an anaphylactic reaction to a certain soap given that he has multiple allergies -Likely the patient did have a seizure and if is unclear if related to electrolyte abnormalities -Since he was suspected to have a seizure, seizure precautions were given: -SEIZURE PRECAUTIONS Per [facility] statutes, patients with seizures are not allowed to drive until they have been seizure-free for six months.    Use caution when using heavy equipment or power tools. Avoid working on ladders or at heights. Take showers instead of baths. Ensure the water temperature is not too high on the home water heater. Do not go swimming alone. Do not lock yourself in a room alone (i.e. bathroom). When caring for infants or small children, sit down when holding, feeding, or changing them to minimize risk of injury to the child in the event you have a seizure. Maintain good sleep hygiene. Avoid alcohol.    If patient has another seizure, call 911 and bring them back to the ED if: A.  The seizure lasts longer than 5 minutes.      B.  The patient doesn't wake  shortly after the seizure or has new problems such as difficulty seeing, speaking or moving following the seizure C.  The patient was injured during the seizure D.  The patient has a temperature over 102 F (39C) E.  The patient vomited during the seizure and now is having trouble breathing  -Neurology is making him an outpatient appointment with a neurologist.   Hypokalemia, improved significantly -No obvious cause.  Patient denies nausea vomiting or diarrhea.  He has not had any medications including diuretics.   -At this point we will replete potassium.  Check magnesium level. -Encourage patient to stay hydrated at home.   -If magnesium is low this may also play a role in possible syncope if he had arrhythmias. -Patient's has now normalized -Mag level was 2.2 -Replete with IV KCl 30 mEq of p.o. KCl 40 mEq twice daily x2 doses -Continue monitor and trend and replete as necessary -Repeat CMP within 1 week  Anaphylactic reaction -Patient was suspected to have had anaphylactic reaction prior to admission to the soap however likely had a seizure. -He is usually having reactions to the substance listed above.  Suspected that the soap that he  is doing for shower may have caused at this time around.  He will avoid that.   -Received 2 EpiPen's in the EMS  -In the meantime monitor the patient.  Treat with steroids Benadryl as needed.  Also epinephrine. -Monitor closely in outpatient setting   Generalized Anxiety Disorder -Confirm home regimen and continue.  Add Ativan as needed   Leukocytosis  -Possibly demargination and dehydration and now likely in the setting of possible seizure -WBC went from 15.3 -> 13.3.  And trended down to 11.6 yesterday and has now normalized at 8.4 -Continue to monitor for signs and symptoms of infection; no infection currently noted and will hold off on antibiotics at this time -Repeat CBC in a.m.   Metabolic Acidosis -Possibly in the setting of seizure-like  activity; now CO2 is improved and is 27 -IV fluid hydration was discontinued -Continue monitor and trend and repeat CMP in a.m.   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   Shellfish Allergy Anaphylaxis        Medication List     TAKE these medications    acetaminophen 500 MG tablet Commonly known as: TYLENOL Take 1,000 mg by mouth daily as needed for headache.   vitamin C 500 MG tablet Commonly known as: ASCORBIC ACID Take 1,000 mg by mouth at bedtime.        Discharge Exam: Filed Weights   12/02/21 1231 12/04/21 0500  Weight: 65.8 kg 65 kg   Vitals:   12/04/21 0912 12/04/21 1233  BP: 119/77 117/85  Pulse: 88 82  Resp: 18 18  Temp: 98 F (36.7 C) 98 F (36.7 C)  SpO2: 100% 99%   Examination: Physical Exam:  Constitutional: Thin Caucasian male currently in no acute distress appears calm Respiratory: Diminished to auscultation bilaterally, no wheezing, rales, rhonchi or crackles. Normal respiratory effort and patient is not tachypenic. No accessory muscle use.  Unlabored breathing Cardiovascular: RRR, no murmurs / rubs / gallops. S1 and S2 auscultated. No extremity edema.    Abdomen: Soft, non-tender, non-distended. Bowel sounds positive.  GU: Deferred. Musculoskeletal: No clubbing / cyanosis of digits/nails. No joint deformity upper and lower extremities.  Skin: Has a slight mark from where he hit his face under his chin but no appreciable rashes or lesions Neurologic: CN 2-12 grossly intact with no focal deficits.  Romberg sign and cerebellar reflexes not assessed.  Psychiatric: Normal judgment and insight. Alert and oriented x 3. Normal mood and appropriate affect.   Condition at discharge: stable  The results of significant diagnostics from this hospitalization (including imaging, microbiology, ancillary and  laboratory) are listed below for reference.   Imaging Studies: MR BRAIN W CONTRAST  Result Date: 12/04/2021 CLINICAL DATA:  Seizure, new-onset, no history of trauma. EXAM: MRI HEAD WITH CONTRAST TECHNIQUE: Multiplanar, multiecho pulse sequences of the brain and surrounding structures were obtained with intravenous contrast. CONTRAST:  6mL GADAVIST GADOBUTROL 1 MMOL/ML IV SOLN COMPARISON:  None Available. FINDINGS: Postcontrast images of the brain show no evidence of abnormal contrast enhancement. Incidental small developmental venous anomaly in the anterior right parietal lobe. Vascular structures shows normal contrast enhancement. IMPRESSION: Unremarkable postcontrast images of the brain. Electronically Signed   By: Baldemar Lenis M.D.   On: 12/04/2021 14:03   EEG adult  Result Date: 12/03/2021 Charlsie Quest, MD     12/03/2021  5:48 PM Patient Name: JUDEA RICHES MRN: [id number] Epilepsy Attending: Charlsie Quest Referring Physician/Provider: Merlene Laughter, DO Date: 12/03/2021 Duration: 22.19 mins Patient history: 26yo M with syncope. EEG to evaluate for seizure. Level of alertness: Awake, drowsy AEDs during EEG study: None Technical aspects: This EEG study was done with scalp electrodes positioned according to the 10-20  International system of electrode placement. Electrical activity was acquired at a sampling rate of  and reviewed with a high frequency filter of  and a low frequency filter of . EEG data were recorded continuously and digitally stored. Description: The posterior dominant rhythm consists of 9 Hz activity of moderate voltage (25-35 uV) seen predominantly in posterior head regions, symmetric and reactive to eye opening and eye closing. Drowsiness was characterized by attenuation of the posterior background rhythm. Hyperventilation and photic stimulation were not performed.   IMPRESSION: This study is within normal limits. No seizures or epileptiform discharges were seen throughout the recording. Charlsie Quest   ECHOCARDIOGRAM COMPLETE  Result Date: 12/03/2021    ECHOCARDIOGRAM REPORT   Patient Name:   PARTICK MUSSELMAN Date of Exam: 12/03/2021 Medical Rec #:  [id number]          Height:       72.0 in Accession #:    [id number]         Weight:       145.0 lb Date of Birth:  07-24-1995           BSA:          1.858 m Patient Age:    26 years           BP:           121/70 mmHg Patient Gender: M                  HR:           91 bpm. Exam Location:  Inpatient Procedure: 2D Echo, Cardiac Doppler and Color Doppler Indications:    Syncope  History:        Patient has no prior history of Echocardiogram examinations.  Sonographer:    Rodrigo Ran RCS Referring Phys: [phone] MOHAMMAD L GARBA IMPRESSIONS  1. Left ventricular ejection fraction, by estimation, is 60 to 65%. The left ventricle has normal function. The left ventricle has no regional wall motion abnormalities. Left ventricular diastolic parameters were normal.  2. Right ventricular systolic function is normal. The right ventricular size is normal.  3. The mitral valve is normal in structure. Trivial mitral valve regurgitation. No evidence of mitral stenosis.  4. The aortic valve is normal in structure. Aortic valve regurgitation is not visualized.  No aortic stenosis is present.  5. The inferior vena cava is normal in size with greater than 50% respiratory variability, suggesting right atrial pressure of 3 mmHg. FINDINGS  Left Ventricle: Left ventricular ejection fraction, by estimation, is 60 to 65%. The left ventricle has normal function. The left ventricle has no regional wall motion abnormalities. The left ventricular internal cavity size was normal in size. There is  no left ventricular hypertrophy. Left ventricular diastolic parameters were normal. Right Ventricle: The right ventricular size is normal. No increase in right ventricular wall thickness. Right ventricular systolic function is normal. Left Atrium: Left atrial size was normal in size. Right Atrium: Right atrial size was normal in size. Pericardium: There is no evidence of pericardial effusion. Mitral Valve: The mitral valve is normal in structure. Trivial mitral valve regurgitation. No evidence of mitral valve stenosis. Tricuspid Valve: The tricuspid valve is normal in structure. Tricuspid valve regurgitation is trivial. No evidence of tricuspid stenosis. Aortic Valve: The aortic valve is normal in structure. Aortic valve regurgitation is not visualized. No aortic stenosis is present. Aortic valve mean gradient measures 5.0 mmHg. Aortic valve peak gradient measures 8.8 mmHg. Aortic valve area, by VTI measures 1.96 cm. Pulmonic Valve: The pulmonic valve was normal in structure. Pulmonic valve regurgitation is trivial. No evidence of pulmonic stenosis. Aorta: The aortic root is normal in size and structure. Venous: The inferior vena cava is normal in size with greater than 50% respiratory variability, suggesting right atrial pressure of 3 mmHg. IAS/Shunts: No atrial level shunt detected by color flow Doppler.  LEFT VENTRICLE PLAX 2D LVIDd:         4.20 cm   Diastology LVIDs:         2.70 cm   LV e' medial:    15.20 cm/s LV PW:         0.80 cm   LV E/e' medial:  6.8 LV IVS:        0.70 cm   LV  e' lateral:   20.30 cm/s LVOT diam:     1.80 cm   LV E/e' lateral: 5.1 LV SV:         59 LV SV Index:   31 LVOT Area:     2.54 cm  RIGHT VENTRICLE             IVC RV Basal diam:  3.10 cm     IVC diam: 1.70 cm RV Mid diam:    1.90 cm RV S prime:     15.10 cm/s TAPSE (M-mode): 2.8 cm LEFT ATRIUM             Index        RIGHT ATRIUM           Index LA diam:        2.80 cm 1.51 cm/m   RA Area:     13.30 cm LA Vol (A2C):   38.8 ml 20.88 ml/m  RA Volume:   30.10 ml  16.20 ml/m LA Vol (A4C):   21.7 ml 11.68 ml/m LA Biplane Vol: 30.3 ml 16.30 ml/m  AORTIC VALVE                     PULMONIC VALVE AV Area (Vmax):    1.86 cm      PV Vmax:          1.18 m/s AV Area (Vmean):   1.92 cm      PV Peak grad:     5.6 mmHg AV Area (VTI):  1.96 cm      PR End Diast Vel: 8.18 msec AV Vmax:           148.00 cm/s AV Vmean:          113.000 cm/s AV VTI:            0.298 m AV Peak Grad:      8.8 mmHg AV Mean Grad:      5.0 mmHg LVOT Vmax:         108.00 cm/s LVOT Vmean:        85.100 cm/s LVOT VTI:          0.230 m LVOT/AV VTI ratio: 0.77  AORTA Ao Root diam: 3.00 cm Ao Asc diam:  2.60 cm MITRAL VALVE                TRICUSPID VALVE MV Area (PHT): 5.66 cm     TR Peak grad:   21.5 mmHg MV Decel Time: 134 msec     TR Vmax:        232.00 cm/s MV E velocity: 103.00 cm/s                             SHUNTS                             Systemic VTI:  0.23 m                             Systemic Diam: 1.80 cm Arvilla Meres MD Electronically signed by Arvilla Meres MD Signature Date/Time: 12/03/2021/3:26:38 PM    Final    MR BRAIN WO CONTRAST  Result Date: 12/03/2021 CLINICAL DATA:  26 year old male with syncope, seizure. EXAM: MRI HEAD WITHOUT CONTRAST TECHNIQUE: Multiplanar, multiecho pulse sequences of the brain and surrounding structures were obtained without intravenous contrast. COMPARISON:  CT head and cervical spine yesterday. FINDINGS: Brain: No restricted diffusion to suggest acute infarction. No midline shift, mass  effect, evidence of mass lesion, ventriculomegaly, extra-axial collection or acute intracranial hemorrhage. Cervicomedullary junction and pituitary are within normal limits. Hippocampal complexes appear symmetric and within normal limits. Other mesial temporal lobe structures appear normal. Gray and white matter signal is within normal limits throughout the brain. No encephalomalacia or chronic cerebral blood products identified. Vascular: Major intracranial vascular flow voids are preserved. Skull and upper cervical spine: Negative. Visualized bone marrow signal is within normal limits. Sinuses/Orbits: Negative orbits. Small left maxillary sinus mucous retention cysts. Other: Mastoids are clear. Visible internal auditory structures appear normal. Negative visible scalp and face. IMPRESSION: Normal noncontrast MRI appearance of the brain. Electronically Signed   By: Odessa Fleming M.D.   On: 12/03/2021 07:10   CT HEAD WO CONTRAST ( )  Result Date: 12/02/2021 CLINICAL DATA:  Fall with possible seizure and head injury while in shower. EXAM: CT HEAD WITHOUT CONTRAST CT CERVICAL SPINE WITHOUT CONTRAST TECHNIQUE: Multidetector CT imaging of the head and cervical spine was performed following the standard protocol without intravenous contrast. Multiplanar CT image reconstructions of the cervical spine were also generated. RADIATION DOSE REDUCTION: This exam was performed according to the departmental dose-optimization program which includes automated exposure control, adjustment of the mA and/or kV according to patient size and/or use of iterative reconstruction technique. COMPARISON:  None Available. FINDINGS: CT HEAD FINDINGS Brain: Ventricles, cisterns and other CSF spaces are normal.  There is no mass, mass effect, shift of midline structures or acute hemorrhage. No evidence of acute infarction. Vascular: No hyperdense vessel or unexpected calcification. Skull: Normal. Negative for fracture or focal lesion.  Sinuses/Orbits: No acute finding. Other: None. CT CERVICAL SPINE FINDINGS Alignment: Normal. Skull base and vertebrae: Vertebral body heights are normal. Atlantoaxial articulation is normal. No significant neural foraminal narrowing. No acute fracture. Soft tissues and spinal canal: Prevertebral soft tissues are normal. No significant spinal canal stenosis. Disc levels:  Minimal broad-based disc bulge at the C3-4 level. Upper chest: No acute findings. Other: None. IMPRESSION: 1. Normal head CT. 2. No acute cervical spine injury. 3. 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 Electronically Signed   By: Elberta Fortisaniel  Boyle M.D.   On: 12/02/2021 14:21   CT Cervical Spine Wo Contrast  Result Date: 12/02/2021 CLINICAL DATA:  Fall with possible seizure and head injury while in shower. EXAM: CT HEAD WITHOUT CONTRAST CT CERVICAL SPINE WITHOUT CONTRAST TECHNIQUE: Multidetector CT imaging of the head and cervical spine was performed following the standard protocol without intravenous contrast. Multiplanar CT image reconstructions of the cervical spine were also generated. RADIATION DOSE REDUCTION: This exam was performed according to the departmental dose-optimization program which includes automated exposure control, adjustment of the mA and/or kV according to patient size and/or use of iterative reconstruction technique. COMPARISON:  None Available. FINDINGS: CT HEAD FINDINGS Brain: Ventricles, cisterns and other CSF spaces are normal. There is no mass, mass effect, shift of midline structures or acute hemorrhage. No evidence of acute infarction. Vascular: No hyperdense vessel or unexpected calcification. Skull: Normal. Negative for fracture or focal lesion. Sinuses/Orbits: No acute finding. Other: None. CT CERVICAL SPINE FINDINGS Alignment: Normal. Skull base and vertebrae: Vertebral body heights are normal. Atlantoaxial articulation is normal. No significant neural foraminal narrowing. No acute fracture. Soft  tissues and spinal canal: Prevertebral soft tissues are normal. No significant spinal canal stenosis. Disc levels:  Minimal broad-based disc bulge at the C3-4 level. Upper chest: No acute findings. Other: None. IMPRESSION: 1. Normal head CT. 2. No acute cervical spine injury. 3. 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 Electronically Signed   By: Elberta Fortisaniel  Boyle M.D.   On: 12/02/2021 14:21   DG Chest Portable 1 View  Result Date: 12/02/2021 CLINICAL DATA:  Fall with possible seizure in shower. EXAM: PORTABLE CHEST 1 VIEW COMPARISON:  None Available. FINDINGS: Lungs are adequately inflated without consolidation, effusion or pneumothorax. Cardiomediastinal silhouette is normal. No acute fracture. IMPRESSION: No acute findings. Electronically Signed   By: Elberta Fortisaniel  Boyle M.D.   On: 12/02/2021 14:03    Microbiology: No results found for this or any previous visit.  Labs: CBC: Recent Labs  Lab 12/02/21 1236 12/03/21 0435 12/03/21 1656 12/04/21 0311  WBC 15.3* 13.3* 11.6* 8.4  NEUTROABS 14.1*  --  8.9* 4.7  HGB 14.7 13.1 14.2 13.4  HCT 42.7 38.0* 41.0 39.0  MCV 83.6 83.9 83.7 84.2  PLT 183 193 206 190   Basic Metabolic Panel: Recent Labs  Lab 12/02/21 1512 12/03/21 0435 12/03/21 1204 12/03/21 1656 12/03/21 2138 12/04/21 0311  NA 144 144 144 142 142 142  141  K 2.8* 4.9 2.7* 4.0 3.9 4.0  4.0  CL 117* 123* 121* 110 108 107  107  CO2 21* 18* 19* 27 29 27  27   GLUCOSE 107* 102* 96 109* 107* 106*  105*  BUN 21* 14 11 11 15 18  17   CREATININE 0.84 0.74 0.56* 0.95  1.14 0.91  0.88  CALCIUM 7.7* 6.9* 8.2* 9.1 9.1 8.7*  8.5*  MG 2.2  --   --  2.1  --  2.2  PHOS  --   --   --  3.2  --  4.3   Liver Function Tests: Recent Labs  Lab 12/02/21 1236 12/03/21 0435 12/03/21 1656 12/04/21 0311  AST 17 22 32 27  ALT 13 14 22 22   ALKPHOS 31* 30* 44 33*  BILITOT 0.6 0.8 0.8 0.9  PROT 4.5* 4.6* 6.7 5.9*  ALBUMIN 3.0* 3.1* 4.4 3.9   CBG: Recent Labs  Lab 12/02/21 1223  12/03/21 0435 12/04/21 0538 12/04/21 0720  GLUCAP 165* 119* 103* 100*   Discharge time spent: greater than 30 minutes.  Signed: 12/06/21, DO [facility] 12/04/2021

## 2021-12-19 ENCOUNTER — Ambulatory Visit: Payer: No Typology Code available for payment source | Admitting: Neurology

## 2021-12-19 ENCOUNTER — Encounter: Payer: Self-pay | Admitting: Neurology

## 2021-12-19 VITALS — BP 130/85 | HR 90 | Ht 72.0 in | Wt 148.5 lb

## 2021-12-19 DIAGNOSIS — R55 Syncope and collapse: Secondary | ICD-10-CM | POA: Diagnosis not present

## 2021-12-19 NOTE — Patient Instructions (Signed)
Continue to follow-up with your PCP Return as needed

## 2021-12-19 NOTE — Progress Notes (Signed)
[facility]  PATIENT: Danny Rich DOB: Dec 02, 1995  REQUESTING CLINICIAN: Jefferson Fuel, MD HISTORY FROM: Patient and mother  REASON FOR VISIT: Seizure vs. Syncope    HISTORICAL  CHIEF COMPLAINT:  Chief Complaint  Patient presents with   New Patient (Initial Visit)    Rm 12. Accompanied by mom. NP/internal hospital referral for seizure like activity.    HISTORY OF PRESENT ILLNESS:  This is a 25 year old gentleman past medical history of multiple food allergy who is presenting after seizure versus syncope.  Patient reports on June 18 he was taking a shower, taking a hot shower then got out of the bathroom and next and that he knows is people are around him.  He noted that when he woke up, he knew he was in his house and he was able to recognize his mother but he was just confused of why people are in his bedroom.  Mother reported that his skin was red but knowing him and his allergy reaction this was not a definitive allergy reaction.  He was taken to the ED treated for anaphylaxis, and by then he was back to his normal self.  He was seen by neurology.  Had full work-up including MRI and EEG which was both negative.  He denies any previous history of seizures, family history of seizures include a first cousin with epilepsy but otherwise no other risk factors. With his history of allergy, he denies trying any new medications, denies trying any new product.  The soap that he used the day he has used in the past, same brain.   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   Shellfish Allergy Anaphylaxis    HOME MEDICATIONS: Outpatient Medications Prior to Visit  Medication Sig Dispense Refill   acetaminophen (TYLENOL) 500 MG tablet Take 1,000 mg by mouth daily as needed for headache.     Cholecalciferol 1.25 MG (50000 UT) capsule Take 1 capsule by mouth once a week.     vitamin C (ASCORBIC ACID) 500 MG tablet Take 1,000 mg by mouth at bedtime.     No facility-administered medications prior to visit.    PAST MEDICAL HISTORY: History reviewed. No pertinent past medical history.  PAST SURGICAL HISTORY: History reviewed. No pertinent surgical history.  FAMILY HISTORY: History reviewed. No pertinent family history.  SOCIAL HISTORY: Social History   Socioeconomic History   Marital status: Single    Spouse name: Not on file   Number of children: Not on file   Years of education: Not on file  Highest education level: Not on file  Occupational History   Not on file  Tobacco Use   Smoking status: Never   Smokeless tobacco: Never  Substance and Sexual Activity   Alcohol use: Not on file   Drug use: Not on file   Sexual activity: Not on file  Other Topics Concern   Not on file  Social History Narrative   Not on file   Social Determinants of Health   Financial Resource Strain: Not on file  Food Insecurity: Not on file  Transportation Needs: Not on file  Physical Activity: Not on file  Stress: Not on file  Social Connections: Not on file  Intimate Partner Violence: Not on file    PHYSICAL  EXAM  GENERAL EXAM/CONSTITUTIONAL: Vitals:  Vitals:   12/19/21 1407  BP: 130/85  Pulse: 90  Weight: 148 lb 8 oz (67.4 kg)  Height: 6' (1.829 m)   Body mass index is 20.14 kg/m. Wt Readings from Last 3 Encounters:  12/19/21 148 lb 8 oz (67.4 kg)  12/04/21 143 lb 4.8 oz (65 kg)   Patient is in no distress; well developed, nourished and groomed; neck is supple  EYES: Pupils round and reactive to light, Visual fields full to confrontation, Extraocular movements intacts,  No results found.  MUSCULOSKELETAL: Gait, strength, tone, movements noted in Neurologic exam below  NEUROLOGIC: MENTAL STATUS:      No data to display         awake, alert, oriented to person, place and time recent and remote memory intact normal attention and concentration language fluent, comprehension intact, naming intact fund of knowledge appropriate  CRANIAL NERVE:  2nd - no papilledema or hemorrhages on fundoscopic exam 2nd, 3rd, 4th, 6th - pupils equal and reactive to light, visual fields full to confrontation, extraocular muscles intact, no nystagmus 5th - facial sensation symmetric 7th - facial strength symmetric 8th - hearing intact 9th - palate elevates symmetrically, uvula midline 11th - shoulder shrug symmetric 12th - tongue protrusion midline  MOTOR:  normal bulk and tone, full strength in the BUE, BLE  SENSORY:  normal and symmetric to light touch  COORDINATION:  finger-nose-finger, fine finger movements normal  REFLEXES:  deep tendon reflexes present and symmetric  GAIT/STATION:  normal   DIAGNOSTIC DATA (LABS, IMAGING, TESTING) - I reviewed patient records, labs, notes, testing and imaging myself where available.  Lab Results  Component Value Date   WBC 8.4 12/04/2021   HGB 13.4 12/04/2021   HCT 39.0 12/04/2021   MCV 84.2 12/04/2021   PLT 190 12/04/2021      Component Value Date/Time   NA 141 12/04/2021 0311   NA 142 12/04/2021 0311   K 4.0 12/04/2021 0311    K 4.0 12/04/2021 0311   CL 107 12/04/2021 0311   CL 107 12/04/2021 0311   CO2 27 12/04/2021 0311   CO2 27 12/04/2021 0311   GLUCOSE 105 (H) 12/04/2021 0311   GLUCOSE 106 (H) 12/04/2021 0311   BUN 17 12/04/2021 0311   BUN 18 12/04/2021 0311   CREATININE 0.88 12/04/2021 0311   CREATININE 0.91 12/04/2021 0311   CALCIUM 8.5 (L) 12/04/2021 0311   CALCIUM 8.7 (L) 12/04/2021 0311   PROT 5.9 (L) 12/04/2021 0311   ALBUMIN 3.9 12/04/2021 0311   AST 27 12/04/2021 0311   ALT 22 12/04/2021 0311   ALKPHOS 33 (L) 12/04/2021 0311   BILITOT 0.9 12/04/2021 0311   GFRNONAA >60 12/04/2021 0311   GFRNONAA >60 12/04/2021 7001  No results found for: "CHOL", "HDL", "LDLCALC", "LDLDIRECT", "TRIG" No results found for: "HGBA1C" No results found for: "VITAMINB12" Lab Results  Component Value Date   TSH 0.337 (L) 12/02/2021   MRI Brain with contrast  Normal noncontrast MRI appearance of the brain.  EEG 12/03/21 This study is within normal limits. No seizures or epileptiform discharges were seen throughout the recording     ASSESSMENT AND PLAN  26 y.o. year old male  with history of multiple food allergy who is presenting after a syncopal versus seizure.  He denies trying any new foods, any new product but states that day he just got out from a hot shower.  He denies any tongue biting, no urinary incontinence and no postictal confusion.  I have informed patient that he likely had syncope/convulsive syncope related to the hot shower.  I explained to the patient that due to hot shower he might have had marked vasodilation to cool off his body temperature causing the syncope.  On exam today he is nonfocal.  I have explained to them that there are no further testings indicated at the moment.  I will not start him on medication.  I do believe that this was a syncope therefore patient can resume driving.  He is comfortable with plan.  Continue to follow with the PCP and return as needed.   1. Convulsive  syncope     Patient Instructions  Continue to follow-up with your PCP Return as needed   Per [facility] statutes, patients with seizures are not allowed to drive until they have been seizure-free for six months.  Other recommendations include using caution when using heavy equipment or power tools. Avoid working on ladders or at heights. Take showers instead of baths.  Do not swim alone.  Ensure the water temperature is not too high on the home water heater. Do not go swimming alone. Do not lock yourself in a room alone (i.e. bathroom). When caring for infants or small children, sit down when holding, feeding, or changing them to minimize risk of injury to the child in the event you have a seizure. Maintain good sleep hygiene. Avoid alcohol.  Also recommend adequate sleep, hydration, good diet and minimize stress.   During the Seizure  - First, ensure adequate ventilation and place patients on the floor on their left side  Loosen clothing around the neck and ensure the airway is patent. If the patient is clenching the teeth, do not force the mouth open with any object as this can cause severe damage - Remove all items from the surrounding that can be hazardous. The patient may be oblivious to what's happening and may not even know what he or she is doing. If the patient is confused and wandering, either gently guide him/her away and block access to outside areas - Reassure the individual and be comforting - Call 911. In most cases, the seizure ends before EMS arrives. However, there are cases when seizures may last over 3 to 5 minutes. Or the individual may have developed breathing difficulties or severe injuries. If a pregnant patient or a person with diabetes develops a seizure, it is prudent to call an ambulance. - Finally, if the patient does not regain full consciousness, then call EMS. Most patients will remain confused for about 45 to 90 minutes after a seizure, so you must use  judgment in calling for help. - Avoid restraints but make sure the patient is in a bed with padded side rails -  Place the individual in a lateral position with the neck slightly flexed; this will help the saliva drain from the mouth and prevent the tongue from falling backward - Remove all nearby furniture and other hazards from the area - Provide verbal assurance as the individual is regaining consciousness - Provide the patient with privacy if possible - Call for help and start treatment as ordered by the caregiver   After the Seizure (Postictal Stage)  After a seizure, most patients experience confusion, fatigue, muscle pain and/or a headache. Thus, one should permit the individual to sleep. For the next few days, reassurance is essential. Being calm and helping reorient the person is also of importance.  Most seizures are painless and end spontaneously. Seizures are not harmful to others but can lead to complications such as stress on the lungs, brain and the heart. Individuals with prior lung problems may develop labored breathing and respiratory distress.     No orders of the defined types were placed in this encounter.   No orders of the defined types were placed in this encounter.   Return if symptoms worsen or fail to improve.  I have spent a total of 50 minutes dedicated to this patient today, preparing to see patient, performing a medically appropriate examination and evaluation, ordering tests and/or medications and procedures, and counseling and educating the patient/family/caregiver; independently interpreting result and communicating results to the family/patient/caregiver; and documenting clinical information in the electronic medical record.   Windell Norfolk, MD 12/19/2021, 5:09 PM  [facility] [address], [address] [place], [place] [address] [phone]
